# Patient Record
Sex: Female | Born: 1970 | Race: Black or African American | Hispanic: No | Marital: Single | Smoking: Never smoker
Health system: Southern US, Community
[De-identification: ages and names within clinical notes are randomized; demographics above are authoritative.]

## PROBLEM LIST (undated history)

## (undated) DIAGNOSIS — D803 Selective deficiency of immunoglobulin G [IgG] subclasses: Secondary | ICD-10-CM

## (undated) DIAGNOSIS — M869 Osteomyelitis, unspecified: Secondary | ICD-10-CM

## (undated) DIAGNOSIS — K116 Mucocele of salivary gland: Secondary | ICD-10-CM

## (undated) DIAGNOSIS — J189 Pneumonia, unspecified organism: Secondary | ICD-10-CM

## (undated) DIAGNOSIS — IMO0001 Reserved for inherently not codable concepts without codable children: Secondary | ICD-10-CM

## (undated) DIAGNOSIS — D573 Sickle-cell trait: Secondary | ICD-10-CM

## (undated) DIAGNOSIS — M009 Pyogenic arthritis, unspecified: Secondary | ICD-10-CM

## (undated) DIAGNOSIS — B20 Human immunodeficiency virus [HIV] disease: Secondary | ICD-10-CM

## (undated) DIAGNOSIS — I272 Pulmonary hypertension, unspecified: Secondary | ICD-10-CM

## (undated) DIAGNOSIS — D649 Anemia, unspecified: Secondary | ICD-10-CM

## (undated) HISTORY — PX: NO PAST SURGERIES: SHX2092

## (undated) HISTORY — PX: SHOULDER ARTHROSCOPY: SHX128

---

## 2014-12-10 ENCOUNTER — Encounter: Payer: Self-pay | Admitting: Dietician

## 2014-12-10 ENCOUNTER — Encounter: Payer: Medicare Other | Attending: Gastroenterology | Admitting: Dietician

## 2014-12-10 VITALS — Ht 60.0 in | Wt <= 1120 oz

## 2014-12-10 DIAGNOSIS — E43 Unspecified severe protein-calorie malnutrition: Secondary | ICD-10-CM | POA: Diagnosis present

## 2014-12-10 DIAGNOSIS — R636 Underweight: Secondary | ICD-10-CM

## 2014-12-10 DIAGNOSIS — R634 Abnormal weight loss: Secondary | ICD-10-CM | POA: Diagnosis present

## 2014-12-10 NOTE — Patient Instructions (Signed)
   Try an Ensure drink or Carnation breakfast drink 1-2 times a day. Or add dry, powdered milk into milk or pudding or other milky foods.   Continue to eat every 2-3 hours throughout the day. Include a protein food each time you eat (meat, cheese, nuts, peanut butter, eggs)  Talk to your doctor about whether a probiotic supplement such a Culturelle, or florastor might be a good idea.   Boost calories in foods by adding some extra healthy fat, such as vegetable oil, salad dressing, mayonnaise, or soft margarine.

## 2014-12-10 NOTE — Progress Notes (Signed)
Medical Nutrition Therapy: Visit start time: 1300  end time: 1400  Assessment:  Diagnosis: abnormal weight loss, severely malnourished Past medical history: HIV, pulmonary hypertension Psychosocial issues/ stress concerns: none per patient Preferred learning method:  Jill Alexanders . Hands-on  Current weight: 58.6lbs  Height: 5'0" Medications, supplements: reviewed list in chart with patient  Progress and evaluation: Patient reports weight loss of 50+lbs since she developed esophageal issues 2 months ago.     Now diagnosed with candidiasis, pain has resolved, and she is able to eat normally again, but weight gain has been minimal thus far.   Physical activity: none  Dietary Intake:  Usual eating pattern includes 3 meals and multiple snacks per day. Dining out frequency: 0 meals per week.  Breakfast: cereal, milk Snack: same as pm snack(s) Lunch: 1-2pm sandwich or leftovers Snack: chips, nuts and raisins, cookies, granola bars, ice cream Supper: 8/14 ham, greens, squash with onion Snack: cereal or milkshake Beverages: water, juices, soda, tea  Nutrition Care Education: Topics covered: weight gain, esophageal candidiasis Basic nutrition: basic food groups, appropriate nutrient balance. Importance of adequate protein to increase lean body mass. Weight gain: 1800kcal meal plan including 11oz protein foods, 13 servings carbohydrate, 6-7 servings added fats. Ways to increase kcal content of foods, nutrition supplements.   Healthy goal for weight. Esphageal candidiasis:  Causes of and prevention: discussed potential benefit from probiotic supplement, limiting sugary foods and drinks.   Nutritional Diagnosis:  [place]-3.1 Underweight and [place]-3.2 Unintentional weight loss As related to esophageal pain.  As evidenced by BMI of 11.  Intervention: Instruction as noted above.   Patient is eating at regular intervals and seems to have good appetite, eating some calorie-dense foods.   Encouraged addition  of nutritional supplement to not only boost calories, but also increase overall vitamin intake.   Will plan to meet with patient briefly in 2 weeks to check weight and progress. Will schedule further follow-up at that time if needed.  Education Materials given:  . Food lists/ Planning A Balanced Meal . Sample meal pattern/ menus: Quick and Healthy Meal Ideas . Snacking handout . Goals/ instructions . Weight Gain the Healthful Way [phone])  Learner/ who was taught:  . Patient  . Family member daughter  Level of understanding: Marland Kitchen Verbalizes/ demonstrates competency  Demonstrated degree of understanding via:   Teach back Learning barriers: . None  Willingness to learn/ readiness for change: . Eager, change in progress  Monitoring and Evaluation:  Dietary intake and body weight      follow up: 12/24/14

## 2014-12-24 ENCOUNTER — Encounter: Payer: Self-pay | Admitting: Dietician

## 2014-12-24 NOTE — Progress Notes (Signed)
Patient came for weight check; weight today 57.4lbs, which is an increase of 0.6lbs in 2 weeks.  She reports eating chicken, fish, potatoes, beans for meals and is eating several snacks daily of nuts, ice cream, or lunchables.  She has purchased some instant breakfast drinks and will begin adding those in, as well as some fruit smoothies. Suggested she try adding protein powder in the smoothies as well.

## 2015-01-15 ENCOUNTER — Telehealth: Payer: Self-pay | Admitting: Dietician

## 2015-01-15 NOTE — Telephone Encounter (Signed)
Called patient to check on her progress; she reports she has started drinking a nutritional drink (ie Ensure) daily.  She has not had another weight check since 8/29, states energy and strength are improving slowly. Scheduled weight check at this office for 01/21/15 at 4:45pm.

## 2015-01-17 ENCOUNTER — Encounter: Payer: Self-pay | Admitting: *Deleted

## 2015-01-18 ENCOUNTER — Ambulatory Visit: Admission: RE | Admit: 2015-01-18 | Payer: Medicare Other | Source: Ambulatory Visit | Admitting: Gastroenterology

## 2015-01-18 HISTORY — DX: Sickle-cell trait: D57.3

## 2015-01-18 HISTORY — DX: Mucocele of salivary gland: K11.6

## 2015-01-18 HISTORY — DX: Pneumonia, unspecified organism: J18.9

## 2015-01-18 HISTORY — DX: Pulmonary hypertension, unspecified: I27.20

## 2015-01-18 HISTORY — DX: Reserved for inherently not codable concepts without codable children: IMO0001

## 2015-01-18 HISTORY — DX: Osteomyelitis, unspecified: M86.9

## 2015-01-18 HISTORY — DX: Human immunodeficiency virus (HIV) disease: B20

## 2015-01-18 HISTORY — DX: Anemia, unspecified: D64.9

## 2015-01-18 HISTORY — DX: Selective deficiency of immunoglobulin g (igg) subclasses: D80.3

## 2015-01-18 HISTORY — DX: Pyogenic arthritis, unspecified: M00.9

## 2015-01-18 SURGERY — ESOPHAGOGASTRODUODENOSCOPY (EGD) WITH PROPOFOL
Anesthesia: General

## 2015-01-22 ENCOUNTER — Emergency Department: Payer: Medicare Other

## 2015-01-22 ENCOUNTER — Emergency Department
Admission: EM | Admit: 2015-01-22 | Discharge: 2015-01-22 | Disposition: A | Discharge status: Home / Self Care | Payer: Medicare Other | Attending: Emergency Medicine | Admitting: Emergency Medicine

## 2015-01-22 DIAGNOSIS — Z880 Allergy status to penicillin: Secondary | ICD-10-CM | POA: Insufficient documentation

## 2015-01-22 DIAGNOSIS — G629 Polyneuropathy, unspecified: Secondary | ICD-10-CM

## 2015-01-22 DIAGNOSIS — Z79899 Other long term (current) drug therapy: Secondary | ICD-10-CM | POA: Insufficient documentation

## 2015-01-22 DIAGNOSIS — N3090 Cystitis, unspecified without hematuria: Secondary | ICD-10-CM | POA: Insufficient documentation

## 2015-01-22 DIAGNOSIS — R200 Anesthesia of skin: Secondary | ICD-10-CM | POA: Diagnosis present

## 2015-01-22 LAB — CBC WITH DIFFERENTIAL/PLATELET
BASOS ABS: 0 10*3/uL (ref 0–0.1)
Basophils Relative: 0 %
EOS PCT: 7 %
Eosinophils Absolute: 0.2 10*3/uL (ref 0–0.7)
HEMATOCRIT: 24.2 % — AB (ref 35.0–47.0)
HEMOGLOBIN: 7.8 g/dL — AB (ref 12.0–16.0)
LYMPHS ABS: 0.4 10*3/uL — AB (ref 1.0–3.6)
LYMPHS PCT: 17 %
MCH: 27.7 pg (ref 26.0–34.0)
MCHC: 32 g/dL (ref 32.0–36.0)
MCV: 86.4 fL (ref 80.0–100.0)
Monocytes Absolute: 0.3 10*3/uL (ref 0.2–0.9)
Monocytes Relative: 11 %
NEUTROS ABS: 1.6 10*3/uL (ref 1.4–6.5)
NEUTROS PCT: 65 %
PLATELETS: 193 10*3/uL (ref 150–440)
RBC: 2.81 MIL/uL — AB (ref 3.80–5.20)
RDW: 15.7 % — ABNORMAL HIGH (ref 11.5–14.5)
WBC: 2.5 10*3/uL — AB (ref 3.6–11.0)

## 2015-01-22 LAB — URINALYSIS COMPLETE WITH MICROSCOPIC (ARMC ONLY)
Bacteria, UA: NONE SEEN
Bilirubin Urine: NEGATIVE
GLUCOSE, UA: NEGATIVE mg/dL
KETONES UR: NEGATIVE mg/dL
NITRITE: POSITIVE — AB
PROTEIN: 100 mg/dL — AB
SPECIFIC GRAVITY, URINE: 1.02 (ref 1.005–1.030)
pH: 6 (ref 5.0–8.0)

## 2015-01-22 LAB — BASIC METABOLIC PANEL
ANION GAP: 6 (ref 5–15)
BUN: 15 mg/dL (ref 6–20)
CHLORIDE: 100 mmol/L — AB (ref 101–111)
CO2: 23 mmol/L (ref 22–32)
Calcium: 8.2 mg/dL — ABNORMAL LOW (ref 8.9–10.3)
Creatinine, Ser: 0.58 mg/dL (ref 0.44–1.00)
GFR calc Af Amer: >60 mL/min (ref >60)
GLUCOSE: 97 mg/dL (ref 65–99)
POTASSIUM: 3.7 mmol/L (ref 3.5–5.1)
Sodium: 129 mmol/L — ABNORMAL LOW (ref 135–145)

## 2015-01-22 LAB — MAGNESIUM: Magnesium: 1.6 mg/dL — ABNORMAL LOW (ref 1.7–2.4)

## 2015-01-22 LAB — PHOSPHORUS: Phosphorus: 3.3 mg/dL (ref 2.5–4.6)

## 2015-01-22 MED ORDER — NITROFURANTOIN MACROCRYSTAL 100 MG PO CAPS: 100.0000 mg | ORAL_CAPSULE | Freq: Two times a day (BID) | ORAL | Status: DC

## 2015-01-22 MED ORDER — NITROFURANTOIN MONOHYD MACRO 100 MG PO CAPS
100.0000 mg | ORAL_CAPSULE | Freq: Once | ORAL | Status: AC
  Administered 2015-01-22: 100 mg via ORAL
  Filled 2015-01-22: qty 1

## 2015-01-22 NOTE — ED Provider Notes (Signed)
[facility] Emergency Department Provider Note  ____________________________________________  Time seen: 5:30 PM  I have reviewed the triage vital signs and the nursing notes.   HISTORY  Chief Complaint Numbness    HPI Donna Hayden is a 44 y.o. female with HIV and chronic neuropathy of both feet who complains of worsened numbness and tingling in both feet and legs over the past month. It is progressively worsening, and more recently she feels like it is interfering with her walking because she can't feel her feet on the floor to smoothly transition her stridor.  Denies any motor weakness. The symptoms started in her feet where she has chronic neuropathy and progressed proximally. No saddle anesthesia. No bowel or urinary retention or incontinence. No back pain. No fevers, no nausea vomiting or diarrhea. No abdominal pain, no recent trauma     Past Medical History  Diagnosis Date  . HIV (human immunodeficiency virus infection)   . Anemia     Iron Deficient  . Osteomyelitis Bilateral Femoral  . Parotid cyst   . Pneumonia   . Pulmonary hypertension   . Selective deficiency of IgG subclasses   . Septic arthritis   . Sickle cell trait   . Shortness of breath dyspnea      There are no active problems to display for this patient.    Past Surgical History  Procedure Laterality Date  . No past surgeries    . Shoulder arthroscopy       Current Outpatient Rx  Name  Route  Sig  Dispense  Refill  . fluconazole (DIFLUCAN) 40 MG/ML suspension            0   . folic acid (FOLVITE) 1 MG tablet   Oral   Take 1 mg by mouth daily.         . ISENTRESS 400 MG tablet            1     Dispense as written.   . nitrofurantoin (MACRODANTIN) 100 MG capsule   Oral   Take 1 capsule (100 mg total) by mouth 2 (two) times daily.   20 capsule   0   . NORVIR 80 MG/ML solution            1     Dispense as written.   Marland Kitchen PREZISTA 800 MG tablet             1     Dispense as written.   . sildenafil (REVATIO) 20 MG tablet   Oral   Take 20 mg by mouth 3 (three) times daily.         . TRUVADA 200-300 MG per tablet            1     Dispense as written.    Compliant with medications. Currently being treated with Diflucan for esophagitis  Allergies Penicillins and Sulfa antibiotics   Family History  Problem Relation Age of Onset  . Diabetes Mother   . Coarctation of the aorta Other     Social History Social History  Substance Use Topics  . Smoking status: Never Smoker   . Smokeless tobacco: Never Used  . Alcohol Use: No    Review of Systems  Constitutional:   No fever or chills. No weight changes Eyes:   No blurry vision or double vision.  ENT:   No sore throat. Cardiovascular:   No chest pain. Respiratory:   No dyspnea or cough. Gastrointestinal:  Negative for abdominal pain, vomiting and diarrhea.  No BRBPR or melena. Genitourinary:   Negative for dysuria, urinary retention, bloody urine, or difficulty urinating. Musculoskeletal:   Negative for back pain. No joint swelling or pain. Skin:   Negative for rash. Neurological:   Negative for headaches, focal weakness. Bilateral lower extremity paresthesias as above. Psychiatric:  No anxiety or depression.   Endocrine:  No hot/cold intolerance, changes in energy, or sleep difficulty.  10-point ROS otherwise negative.  ____________________________________________   PHYSICAL EXAM:  VITAL SIGNS: ED Triage Vitals  Enc Vitals Group     BP 01/22/15 1722 112/57 mmHg     Pulse Rate 01/22/15 1722 123     Resp 01/22/15 1722 20     Temp 01/22/15 1722 97.6 F (36.4 C)     Temp src --      SpO2 01/22/15 1722 88 %     Weight 01/22/15 1733 56 lb (25.401 kg)     Height 01/22/15 1722 5' (1.524 m)     Head Cir --      Peak Flow --      Pain Score 01/22/15 1723 10     Pain Loc --      Pain Edu? --      Excl. in GC? --      Constitutional:   Alert and  oriented. Well appearing and in no distress. Calm and comfortable. Cachectic Eyes:   No scleral icterus. No conjunctival pallor. PERRL. EOMI ENT   Head:   Normocephalic and atraumatic.   Nose:   No congestion/rhinnorhea. No septal hematoma   Mouth/Throat:   MMM, no pharyngeal erythema. No peritonsillar mass. No uvula shift.   Neck:   No stridor. No SubQ emphysema. No meningismus. Hematological/Lymphatic/Immunilogical:   No cervical lymphadenopathy. Cardiovascular:   RRR. Normal and symmetric distal pulses are present in all extremities. No murmurs, rubs, or gallops. Respiratory:   Normal respiratory effort without tachypnea nor retractions. Breath sounds are clear and equal bilaterally. No wheezes/rales/rhonchi. Gastrointestinal:   Soft and nontender. No distention. There is no CVA tenderness.  No rebound, rigidity, or guarding. Genitourinary:   deferred Musculoskeletal:   Nontender with normal range of motion in all extremities. No joint effusions.  No lower extremity tenderness.  No edema. No midline spinal tenderness Neurologic:   Normal speech and language.  CN 2-10 normal. Motor grossly intact. No pronator drift.   No gross focal neurologic deficits are appreciated.  Skin:    Skin is warm, dry and intact. No rash noted.  No petechiae, purpura, or bullae. Psychiatric:   Mood and affect are normal. Speech and behavior are normal. Patient exhibits appropriate insight and judgment.  ____________________________________________    LABS (pertinent positives/negatives) (all labs ordered are listed, but only abnormal results are displayed) Labs Reviewed  CBC WITH DIFFERENTIAL/PLATELET - Abnormal; Notable for the following:    WBC 2.5 (*)    RBC 2.81 (*)    Hemoglobin 7.8 (*)    HCT 24.2 (*)    RDW 15.7 (*)    Lymphs Abs 0.4 (*)    All other components within normal limits  BASIC METABOLIC PANEL - Abnormal; Notable for the following:    Sodium 129 (*)    Chloride 100 (*)     Calcium 8.2 (*)    All other components within normal limits  MAGNESIUM - Abnormal; Notable for the following:    Magnesium 1.6 (*)    All other components within normal limits  URINALYSIS COMPLETEWITH  MICROSCOPIC ([facility] ONLY) - Abnormal; Notable for the following:    Color, Urine AMBER (*)    APPearance CLOUDY (*)    Hgb urine dipstick 2+ (*)    Protein, ur 100 (*)    Nitrite POSITIVE (*)    Leukocytes, UA TRACE (*)    Squamous Epithelial / LPF 0-5 (*)    All other components within normal limits  URINE CULTURE  PHOSPHORUS   01/14/2015 CD4 count obtained outpatient is 29 ____________________________________________   EKG    ____________________________________________    RADIOLOGY  X-ray chest and l lumbar spine unremarkable  ____________________________________________   PROCEDURES   ____________________________________________   INITIAL IMPRESSION / ASSESSMENT AND PLAN / ED COURSE  Pertinent labs & imaging results that were available during my care of the patient were reviewed by me and considered in my medical decision making (see chart for details).  Patient presents with acute worsening of chronic neuropathy in bilateral lobes ovaries. Low suspicion for cauda equina, epidural abscess or hematoma, osteomyelitis or soft tissue infection. She is chronically at this weight and low suspicion of a vitamin deficiency as a cause for the neuropathy. Mental status is intact. We check labs and x-ray. ----------------------------------------- 8:15 PM on 01/22/2015 -----------------------------------------  Workup significant for nitrite positive urinary tract infection. Otherwise unremarkable. CBC consistent with known baseline AIDS. Patient follows up with ID specialist. We'll have her follow-up in 2 days while starting Macrobid. She is otherwise well-appearing, no sepsis no pyelonephritis. Tachycardia  resolved.   ____________________________________________   FINAL CLINICAL IMPRESSION(S) / ED DIAGNOSES  Final diagnoses:  Cystitis  Neuropathy      Sharman Cheek, MD 01/22/15 2016

## 2015-01-22 NOTE — ED Notes (Signed)
Lab notified to add urine, spoke Bjorn Loser, states will add.

## 2015-01-22 NOTE — ED Notes (Signed)
Pt c/o BL leg weakness with numbness/tingling in BL feet that has progressed for the past 2 months, states she is unable to walk now.Donna Hayden

## 2015-01-22 NOTE — Discharge Instructions (Signed)
Peripheral Neuropathy °Peripheral neuropathy is a type of nerve damage. It affects nerves that carry signals between the spinal cord and other parts of the body. These are called peripheral nerves. With peripheral neuropathy, one nerve or a group of nerves may be damaged.  °CAUSES  °Many things can damage peripheral nerves. For some people with peripheral neuropathy, the cause is unknown. Some causes include: °· Diabetes. This is the most common cause of peripheral neuropathy. °· Injury to a nerve. °· Pressure or stress on a nerve that lasts a long time. °· Too little vitamin B. Alcoholism can lead to this. °· Infections. °· Autoimmune diseases, such as multiple sclerosis and systemic lupus erythematosus. °· Inherited nerve diseases. °· Some medicines, such as cancer drugs. °· Toxic substances, such as lead and mercury. °· Too little blood flowing to the legs. °· Kidney disease. °· Thyroid disease. °SIGNS AND SYMPTOMS  °Different people have different symptoms. The symptoms you have will depend on which of your nerves is damaged.  Common symptoms include: °· Loss of feeling (numbness) in the feet and hands. °· Tingling in the feet and hands. °· Pain that burns. °· Very sensitive skin. °· Weakness. °· Not being able to move a part of the body (paralysis). °· Muscle twitching. °· Clumsiness or poor coordination. °· Loss of balance. °· Not being able to control your bladder. °· Feeling dizzy. °· Sexual problems. °DIAGNOSIS  °Peripheral neuropathy is a symptom, not a disease. Finding the cause of peripheral neuropathy can be hard. To figure that out, your health care provider will take a medical history and do a physical exam. A neurological exam will also be done. This involves checking things affected by your brain, spinal cord, and nerves (nervous system). For example, your health care provider will check your reflexes, how you move, and what you can feel.  °Other types of tests may also be ordered, such as: °· Blood  tests. °· A test of the fluid in your spinal cord. °· Imaging tests, such as CT scans or an MRI. °· Electromyography (EMG). This test checks the nerves that control muscles. °· Nerve conduction velocity tests. These tests check how fast messages pass through your nerves. °· Nerve biopsy. A small piece of nerve is removed. It is then checked under a microscope. °TREATMENT  °· Medicine is often used to treat peripheral neuropathy. Medicines may include: °¨ Pain-relieving medicines. Prescription or over-the-counter medicine may be suggested. °¨ Antiseizure medicine. This may be used for pain. °¨ Antidepressants. These also may help ease pain from neuropathy. °¨ Lidocaine. This is a numbing medicine. You might wear a patch or be given a shot. °¨ Mexiletine. This medicine is typically used to help control irregular heart rhythms. °· Surgery. Surgery may be needed to relieve pressure on a nerve or to destroy a nerve that is causing pain. °· Physical therapy to help movement. °· Assistive devices to help movement. °HOME CARE INSTRUCTIONS  °· Only take over-the-counter or prescription medicines as directed by your health care provider. Follow the instructions carefully for any given medicines. Do not take any other medicines without first getting approval from your health care provider. °· If you have diabetes, work closely with your health care provider to keep your blood sugar under control. °· If you have numbness in your feet: °¨ Check every day for signs of injury or infection. Watch for redness, warmth, and swelling. °¨ Wear padded socks and comfortable shoes. These help protect your feet. °· Do not do   things that put pressure on your damaged nerve. °· Do not smoke. Smoking keeps blood from getting to damaged nerves. °· Avoid or limit alcohol. Too much alcohol can cause a lack of B vitamins. These vitamins are needed for healthy nerves. °· Develop a good support system. Coping with peripheral neuropathy can be  stressful. Talk to a mental health specialist or join a support group if you are struggling. °· Follow up with your health care provider as directed. °SEEK MEDICAL CARE IF:  °· You have new signs or symptoms of peripheral neuropathy. °· You are struggling emotionally from dealing with peripheral neuropathy. °· You have a fever. °SEEK IMMEDIATE MEDICAL CARE IF:  °· You have an injury or infection that is not healing. °· You feel very dizzy or begin vomiting. °· You have chest pain. °· You have trouble breathing. °Document Released: 04/03/2002 Document Revised: 12/24/2010 Document Reviewed: 12/19/2012 °ExitCare® Patient Information ©2015 [employer]. This information is not intended to replace advice given to you by your health care provider. Make sure you discuss any questions you have with your health care provider. ° ° °Urinary Tract Infection °Urinary tract infections (UTIs) can develop anywhere along your urinary tract. Your urinary tract is your body's drainage system for removing wastes and extra water. Your urinary tract includes two kidneys, two ureters, a bladder, and a urethra. Your kidneys are a pair of bean-shaped organs. Each kidney is about the size of your fist. They are located below your ribs, one on each side of your spine. °CAUSES °Infections are caused by microbes, which are microscopic organisms, including fungi, viruses, and bacteria. These organisms are so small that they can only be seen through a microscope. Bacteria are the microbes that most commonly cause UTIs. °SYMPTOMS  °Symptoms of UTIs may vary by age and gender of the patient and by the location of the infection. Symptoms in young women typically include a frequent and intense urge to urinate and a painful, burning feeling in the bladder or urethra during urination. Older women and men are more likely to be tired, shaky, and weak and have muscle aches and abdominal pain. A fever may mean the infection is in your kidneys. Other  symptoms of a kidney infection include pain in your back or sides below the ribs, nausea, and vomiting. °DIAGNOSIS °To diagnose a UTI, your caregiver will ask you about your symptoms. Your caregiver also will ask to provide a urine sample. The urine sample will be tested for bacteria and white blood cells. White blood cells are made by your body to help fight infection. °TREATMENT  °Typically, UTIs can be treated with medication. Because most UTIs are caused by a bacterial infection, they usually can be treated with the use of antibiotics. The choice of antibiotic and length of treatment depend on your symptoms and the type of bacteria causing your infection. °HOME CARE INSTRUCTIONS °· If you were prescribed antibiotics, take them exactly as your caregiver instructs you. Finish the medication even if you feel better after you have only taken some of the medication. °· Drink enough water and fluids to keep your urine clear or pale yellow. °· Avoid caffeine, tea, and carbonated beverages. They tend to irritate your bladder. °· Empty your bladder often. Avoid holding urine for long periods of time. °· Empty your bladder before and after sexual intercourse. °· After a bowel movement, women should cleanse from front to back. Use each tissue only once. °SEEK MEDICAL CARE IF:  °· You have   back pain. °· You develop a fever. °· Your symptoms do not begin to resolve within 3 days. °SEEK IMMEDIATE MEDICAL CARE IF:  °· You have severe back pain or lower abdominal pain. °· You develop chills. °· You have nausea or vomiting. °· You have continued burning or discomfort with urination. °MAKE SURE YOU:  °· Understand these instructions. °· Will watch your condition. °· Will get help right away if you are not doing well or get worse. °Document Released: 01/21/2005 Document Revised: 10/13/2011 Document Reviewed: 05/22/2011 °ExitCare® Patient Information ©2015 [employer]. This information is not intended to replace advice given to  you by your health care provider. Make sure you discuss any questions you have with your health care provider. ° °

## 2015-01-24 LAB — URINE CULTURE

## 2015-02-21 ENCOUNTER — Telehealth: Payer: Self-pay | Admitting: Dietician

## 2015-02-21 ENCOUNTER — Encounter: Payer: Self-pay | Admitting: *Deleted

## 2015-02-21 NOTE — Telephone Encounter (Signed)
Called patient to check on progress with weight gain, she reports recent weight of 60lbs, which is an increase of 1.4lbs in the past 2 months.  She states she is still drinking Ensure daily.  Encouraged her to call as needed, and will plan to call again in 4-5 weeks to possibly schedule another weight check.

## 2015-02-22 ENCOUNTER — Encounter: Payer: Self-pay | Admitting: Certified Registered Nurse Anesthetist

## 2015-02-22 ENCOUNTER — Ambulatory Visit: Admission: RE | Admit: 2015-02-22 | Payer: Medicare Other | Source: Ambulatory Visit | Admitting: Gastroenterology

## 2015-02-22 ENCOUNTER — Encounter: Admission: RE | Payer: Self-pay | Source: Ambulatory Visit

## 2015-02-22 SURGERY — ESOPHAGOGASTRODUODENOSCOPY (EGD) WITH PROPOFOL
Anesthesia: General

## 2015-03-01 ENCOUNTER — Emergency Department: Payer: Medicare Other

## 2015-03-01 ENCOUNTER — Inpatient Hospital Stay
Admission: EM | Admit: 2015-03-01 | Discharge: 2015-03-12 | DRG: 199 | Disposition: A | Discharge status: Home / Self Care | Payer: Medicare Other | Attending: Internal Medicine | Admitting: Internal Medicine

## 2015-03-01 ENCOUNTER — Encounter: Payer: Self-pay | Admitting: *Deleted

## 2015-03-01 DIAGNOSIS — H30892 Other chorioretinal inflammations, left eye: Secondary | ICD-10-CM | POA: Diagnosis present

## 2015-03-01 DIAGNOSIS — A419 Sepsis, unspecified organism: Secondary | ICD-10-CM | POA: Diagnosis not present

## 2015-03-01 DIAGNOSIS — R0902 Hypoxemia: Secondary | ICD-10-CM | POA: Diagnosis present

## 2015-03-01 DIAGNOSIS — H5442 Blindness, left eye, normal vision right eye: Secondary | ICD-10-CM | POA: Diagnosis present

## 2015-03-01 DIAGNOSIS — Z515 Encounter for palliative care: Secondary | ICD-10-CM | POA: Diagnosis present

## 2015-03-01 DIAGNOSIS — R64 Cachexia: Secondary | ICD-10-CM | POA: Diagnosis present

## 2015-03-01 DIAGNOSIS — J939 Pneumothorax, unspecified: Secondary | ICD-10-CM | POA: Insufficient documentation

## 2015-03-01 DIAGNOSIS — D709 Neutropenia, unspecified: Secondary | ICD-10-CM | POA: Diagnosis present

## 2015-03-01 DIAGNOSIS — Z833 Family history of diabetes mellitus: Secondary | ICD-10-CM

## 2015-03-01 DIAGNOSIS — E871 Hypo-osmolality and hyponatremia: Secondary | ICD-10-CM | POA: Diagnosis present

## 2015-03-01 DIAGNOSIS — W1830XA Fall on same level, unspecified, initial encounter: Secondary | ICD-10-CM | POA: Diagnosis present

## 2015-03-01 DIAGNOSIS — J189 Pneumonia, unspecified organism: Secondary | ICD-10-CM | POA: Diagnosis present

## 2015-03-01 DIAGNOSIS — E162 Hypoglycemia, unspecified: Secondary | ICD-10-CM | POA: Diagnosis not present

## 2015-03-01 DIAGNOSIS — M869 Osteomyelitis, unspecified: Secondary | ICD-10-CM | POA: Diagnosis present

## 2015-03-01 DIAGNOSIS — B20 Human immunodeficiency virus [HIV] disease: Secondary | ICD-10-CM | POA: Diagnosis present

## 2015-03-01 DIAGNOSIS — E43 Unspecified severe protein-calorie malnutrition: Secondary | ICD-10-CM | POA: Diagnosis present

## 2015-03-01 DIAGNOSIS — Z880 Allergy status to penicillin: Secondary | ICD-10-CM

## 2015-03-01 DIAGNOSIS — Z882 Allergy status to sulfonamides status: Secondary | ICD-10-CM

## 2015-03-01 DIAGNOSIS — R1310 Dysphagia, unspecified: Secondary | ICD-10-CM

## 2015-03-01 DIAGNOSIS — D638 Anemia in other chronic diseases classified elsewhere: Secondary | ICD-10-CM | POA: Diagnosis present

## 2015-03-01 DIAGNOSIS — E876 Hypokalemia: Secondary | ICD-10-CM | POA: Diagnosis present

## 2015-03-01 DIAGNOSIS — N390 Urinary tract infection, site not specified: Secondary | ICD-10-CM | POA: Diagnosis present

## 2015-03-01 DIAGNOSIS — D573 Sickle-cell trait: Secondary | ICD-10-CM | POA: Diagnosis present

## 2015-03-01 DIAGNOSIS — Y92009 Unspecified place in unspecified non-institutional (private) residence as the place of occurrence of the external cause: Secondary | ICD-10-CM

## 2015-03-01 DIAGNOSIS — I272 Other secondary pulmonary hypertension: Secondary | ICD-10-CM | POA: Diagnosis present

## 2015-03-01 DIAGNOSIS — S270XXA Traumatic pneumothorax, initial encounter: Principal | ICD-10-CM | POA: Diagnosis present

## 2015-03-01 DIAGNOSIS — R6521 Severe sepsis with septic shock: Secondary | ICD-10-CM | POA: Diagnosis not present

## 2015-03-01 DIAGNOSIS — R000 Tachycardia, unspecified: Secondary | ICD-10-CM | POA: Diagnosis present

## 2015-03-01 DIAGNOSIS — L8990 Pressure ulcer of unspecified site, unspecified stage: Secondary | ICD-10-CM

## 2015-03-01 DIAGNOSIS — B3781 Candidal esophagitis: Secondary | ICD-10-CM | POA: Diagnosis present

## 2015-03-01 DIAGNOSIS — B259 Cytomegaloviral disease, unspecified: Secondary | ICD-10-CM

## 2015-03-01 DIAGNOSIS — L89323 Pressure ulcer of left buttock, stage 3: Secondary | ICD-10-CM | POA: Diagnosis present

## 2015-03-01 DIAGNOSIS — H3090 Unspecified chorioretinal inflammation, unspecified eye: Secondary | ICD-10-CM

## 2015-03-01 DIAGNOSIS — A498 Other bacterial infections of unspecified site: Secondary | ICD-10-CM | POA: Diagnosis present

## 2015-03-01 DIAGNOSIS — Z9119 Patient's noncompliance with other medical treatment and regimen: Secondary | ICD-10-CM | POA: Diagnosis not present

## 2015-03-01 DIAGNOSIS — L89150 Pressure ulcer of sacral region, unstageable: Secondary | ICD-10-CM | POA: Diagnosis present

## 2015-03-01 LAB — COMPREHENSIVE METABOLIC PANEL
ALT: 7 U/L — ABNORMAL LOW (ref 14–54)
ANION GAP: 3 — AB (ref 5–15)
AST: 29 U/L (ref 15–41)
Albumin: 1.9 g/dL — ABNORMAL LOW (ref 3.5–5.0)
Alkaline Phosphatase: 36 U/L — ABNORMAL LOW (ref 38–126)
BILIRUBIN TOTAL: 0.7 mg/dL (ref 0.3–1.2)
BUN: 13 mg/dL (ref 6–20)
CHLORIDE: 103 mmol/L (ref 101–111)
CO2: 26 mmol/L (ref 22–32)
Calcium: 8.3 mg/dL — ABNORMAL LOW (ref 8.9–10.3)
Creatinine, Ser: 0.61 mg/dL (ref 0.44–1.00)
GFR calc Af Amer: >60 mL/min (ref >60)
Glucose, Bld: 91 mg/dL (ref 65–99)
POTASSIUM: 3.3 mmol/L — AB (ref 3.5–5.1)
Sodium: 132 mmol/L — ABNORMAL LOW (ref 135–145)
TOTAL PROTEIN: 11.3 g/dL — AB (ref 6.5–8.1)

## 2015-03-01 LAB — CBC WITH DIFFERENTIAL/PLATELET
BASOS ABS: 0 10*3/uL (ref 0–0.1)
Eosinophils Absolute: 0 10*3/uL (ref 0–0.7)
HEMATOCRIT: 25.7 % — AB (ref 35.0–47.0)
HEMOGLOBIN: 8.1 g/dL — AB (ref 12.0–16.0)
LYMPHS ABS: 0.6 10*3/uL — AB (ref 1.0–3.6)
MCH: 26.8 pg (ref 26.0–34.0)
MCHC: 31.6 g/dL — AB (ref 32.0–36.0)
MCV: 84.9 fL (ref 80.0–100.0)
MONO ABS: 0.4 10*3/uL (ref 0.2–0.9)
Monocytes Relative: 13 %
NEUTROS ABS: 2.1 10*3/uL (ref 1.4–6.5)
Platelets: 189 10*3/uL (ref 150–440)
RBC: 3.03 MIL/uL — AB (ref 3.80–5.20)
RDW: 18.1 % — AB (ref 11.5–14.5)
WBC: 3.1 10*3/uL — AB (ref 3.6–11.0)

## 2015-03-01 LAB — PROTIME-INR
INR: 1.18
PROTHROMBIN TIME: 15.2 s — AB (ref 11.4–15.0)

## 2015-03-01 MED ORDER — FENTANYL CITRATE (PF) 100 MCG/2ML IJ SOLN
12.5000 ug | Freq: Once | INTRAMUSCULAR | Status: AC
  Administered 2015-03-01: 12.5 ug via INTRAVENOUS
  Filled 2015-03-01 (×2): qty 2

## 2015-03-01 MED ORDER — MORPHINE SULFATE (PF) 2 MG/ML IV SOLN
2.0000 mg | INTRAVENOUS | Status: DC | PRN
  Administered 2015-03-03 – 2015-03-09 (×7): 2 mg via INTRAVENOUS
  Filled 2015-03-01 (×7): qty 1

## 2015-03-01 MED ORDER — SODIUM CHLORIDE 0.9 % IV BOLUS (SEPSIS)
500.0000 mL | Freq: Once | INTRAVENOUS | Status: AC
  Administered 2015-03-01: 500 mL via INTRAVENOUS

## 2015-03-01 MED ORDER — ACETAMINOPHEN 650 MG RE SUPP
650.0000 mg | Freq: Four times a day (QID) | RECTAL | Status: DC | PRN
  Administered 2015-03-06: 650 mg via RECTAL
  Filled 2015-03-01: qty 1

## 2015-03-01 MED ORDER — MORPHINE SULFATE (PF) 2 MG/ML IV SOLN
2.0000 mg | Freq: Once | INTRAVENOUS | Status: AC
  Administered 2015-03-01: 2 mg via INTRAVENOUS
  Filled 2015-03-01: qty 1

## 2015-03-01 MED ORDER — ACETAMINOPHEN 325 MG PO TABS
650.0000 mg | ORAL_TABLET | Freq: Four times a day (QID) | ORAL | Status: DC | PRN
  Administered 2015-03-02 – 2015-03-08 (×3): 650 mg via ORAL
  Filled 2015-03-01 (×3): qty 2

## 2015-03-01 MED ORDER — OXYCODONE HCL 5 MG PO TABS: 5.0000 mg | ORAL_TABLET | ORAL | Status: DC | PRN

## 2015-03-01 MED ORDER — SODIUM CHLORIDE 0.9 % IV SOLN
INTRAVENOUS | Status: DC
  Administered 2015-03-02 – 2015-03-06 (×9): via INTRAVENOUS

## 2015-03-01 MED ORDER — ONDANSETRON HCL 4 MG PO TABS: 4.0000 mg | ORAL_TABLET | Freq: Four times a day (QID) | ORAL | Status: DC | PRN

## 2015-03-01 MED ORDER — IPRATROPIUM-ALBUTEROL 0.5-2.5 (3) MG/3ML IN SOLN: 3.0000 mL | RESPIRATORY_TRACT | Status: DC | PRN

## 2015-03-01 MED ORDER — ONDANSETRON HCL 4 MG/2ML IJ SOLN
4.0000 mg | Freq: Four times a day (QID) | INTRAMUSCULAR | Status: DC | PRN
  Administered 2015-03-04 – 2015-03-09 (×4): 4 mg via INTRAVENOUS
  Filled 2015-03-01 (×4): qty 2

## 2015-03-01 MED ORDER — HEPARIN SODIUM (PORCINE) 5000 UNIT/ML IJ SOLN
5000.0000 [IU] | Freq: Three times a day (TID) | INTRAMUSCULAR | Status: DC
  Administered 2015-03-02 – 2015-03-12 (×30): 5000 [IU] via SUBCUTANEOUS
  Filled 2015-03-01 (×30): qty 1

## 2015-03-01 NOTE — H&P (Signed)
[facility] - [place] at [facility]   PATIENT NAME: Donna LarssonStephanie Hayden    MR#:  [id number]  DATE OF BIRTH:  [birth year]   DATE OF ADMISSION:  03/01/2015  PRIMARY CARE PHYSICIAN: WOODS, Garnette CzechHRISTOPHER WILDRICK, MD   REQUESTING/REFERRING PHYSICIAN: Quale  CHIEF COMPLAINT:   Chief Complaint  Patient presents with  . Shoulder Injury    HISTORY OF PRESENT ILLNESS:  Donna LarssonStephanie Hayden  is a 44 y.o. female with a known history of advanced HIV, pulmonary hypertension who is presenting after mechanical fall. Patient was at home suffered a mechanical fall, felt immediate pain in the right side midaxillary line described as "pain" 8/10 nonradiating now worsening or relieving associated shortness of breath. Found to have a large right-sided pneumothorax. Chest tube placed in emergency department  PAST MEDICAL HISTORY:   Past Medical History  Diagnosis Date  . HIV (human immunodeficiency virus infection) (HCC)   . Anemia     Iron Deficient  . Osteomyelitis (HCC) Bilateral Femoral  . Parotid cyst   . Pneumonia   . Pulmonary hypertension (HCC)   . Selective deficiency of IgG subclasses (HCC)   . Septic arthritis (HCC)   . Sickle cell trait (HCC)   . Shortness of breath dyspnea     PAST SURGICAL HISTORY:   Past Surgical History  Procedure Laterality Date  . No past surgeries    . Shoulder arthroscopy      SOCIAL HISTORY:   Social History  Substance Use Topics  . Smoking status: Never Smoker   . Smokeless tobacco: Never Used  . Alcohol Use: No    FAMILY HISTORY:   Family History  Problem Relation Age of Onset  . Diabetes Mother   . Coarctation of the aorta Other     DRUG ALLERGIES:   Allergies  Allergen Reactions  . Penicillins Hives  . Sulfa Antibiotics Nausea Only and Rash    Rash and nausea. No breathing issues    REVIEW OF SYSTEMS:  REVIEW OF SYSTEMS:  CONSTITUTIONAL: Denies fevers, chills, fatigue, weakness.  EYES: Denies blurred  vision, double vision, or eye pain.  EARS, NOSE, THROAT: Denies tinnitus, ear pain, hearing loss.  RESPIRATORY: denies cough, positive shortness of breath, denies wheezing  CARDIOVASCULAR: Denies chest pain, palpitations, edema.  GASTROINTESTINAL: Denies nausea, vomiting, diarrhea, abdominal pain.  GENITOURINARY: Denies dysuria, hematuria.  ENDOCRINE: Denies nocturia or thyroid problems. HEMATOLOGIC AND LYMPHATIC: Denies easy bruising or bleeding.  SKIN: Denies rash or lesions.  MUSCULOSKELETAL: Denies pain in neck, back, shoulder, knees, hips, or further arthritic symptoms.  NEUROLOGIC: Denies paralysis, paresthesias.  PSYCHIATRIC: Denies anxiety or depressive symptoms. Otherwise full review of systems performed by me is negative.   MEDICATIONS AT HOME:   Prior to Admission medications   Medication Sig Start Date End Date Taking? Authorizing Provider  azithromycin (ZITHROMAX) 200 MG/5ML suspension Take 15 mLs by mouth once a week. 02/14/15  Yes Historical Provider, MD  fluconazole (DIFLUCAN) 40 MG/ML suspension Take 200 mg by mouth daily.    Yes Historical Provider, MD  folic acid (FOLVITE) 1 MG tablet Take 1 mg by mouth daily.    Historical Provider, MD  ISENTRESS 400 MG tablet  10/19/14   Historical Provider, MD  nitrofurantoin (MACRODANTIN) 100 MG capsule Take 1 capsule (100 mg total) by mouth 2 (two) times daily. 01/22/15   Sharman CheekPhillip Stafford, MD  NORVIR 80 MG/ML solution  10/22/14   Historical Provider, MD  PREZISTA 800 MG tablet  10/22/14   Historical Provider,  MD  sildenafil (REVATIO) 20 MG tablet Take 20 mg by mouth 3 (three) times daily.    Historical Provider, MD  TRUVADA 200-300 MG per tablet  10/19/14   Historical Provider, MD      VITAL SIGNS:  Blood pressure 105/76, pulse 85, temperature 98.4 F (36.9 C), temperature source Oral, resp. rate 32, height 5' (1.524 m), weight 56 lb (25.401 kg), SpO2 100 %.  PHYSICAL EXAMINATION:  VITAL SIGNS: Filed Vitals:   03/01/15 2330   BP: 105/76  Pulse: 85  Temp:   Resp: 32   GENERAL:44 y.o.female currently in no acute distress. Chronically ill-appearing HEAD: Normocephalic, atraumatic.  EYES: Pupils equal, round, reactive to light. Extraocular muscles intact. No scleral icterus.  MOUTH: Moist mucosal membrane. Dentition intact. No abscess noted.  EAR, NOSE, THROAT: Clear without exudates. No external lesions.  NECK: Supple. No thyromegaly. No nodules. No JVD.  PULMONARY: Diminished breath sounds right side without frank wheeze rails or rhonchi. No use of accessory muscles, Good respiratory effort. good air entry bilaterally CHEST: Nontender to palpation.  CARDIOVASCULAR: S1 and S2. Regular rate and rhythm. No murmurs, rubs, or gallops. No edema. Pedal pulses 2+ bilaterally.  GASTROINTESTINAL: Soft, nontender, nondistended. No masses. Positive bowel sounds. No hepatosplenomegaly.  MUSCULOSKELETAL: No swelling, clubbing, or edema. Range of motion full in all extremities.  NEUROLOGIC: Cranial nerves II through XII are intact. No gross focal neurological deficits. Sensation intact. Reflexes intact.  SKIN: No ulceration, lesions, rashes, or cyanosis. Skin warm and dry. Turgor intact.  PSYCHIATRIC: Mood, affect within blunted. The patient is awake, alert and oriented x 3. Insight, judgment intact.    LABORATORY PANEL:   CBC  Recent Labs Lab 03/01/15 2110  WBC 3.1*  HGB 8.1*  HCT 25.7*  PLT 189   ------------------------------------------------------------------------------------------------------------------  Chemistries   Recent Labs Lab 03/01/15 2110  NA 132*  K 3.3*  CL 103  CO2 26  GLUCOSE 91  BUN 13  CREATININE 0.61  CALCIUM 8.3*  AST 29  ALT 7*  ALKPHOS 36*  BILITOT 0.7   ------------------------------------------------------------------------------------------------------------------  Cardiac Enzymes No results for input(s): TROPONINI in the last 168  hours. ------------------------------------------------------------------------------------------------------------------  RADIOLOGY:  Dg Shoulder Right  03/01/2015  CLINICAL DATA:  Right shoulder pain after fall. EXAM: RIGHT SHOULDER - 2+ VIEW COMPARISON:  None. FINDINGS: There is no evidence of fracture or dislocation. There is no evidence of arthropathy or other focal bone abnormality. Soft tissues are unremarkable. There is a right sided pneumothorax identified. This measures approximately 15%. No displaced rib fractures identified. IMPRESSION: 1. No evidence for shoulder dislocation or fracture. 2. Right-sided pneumothorax. Recommend further investigation with PA and lateral chest radiograph. Critical Value/emergent results were called by telephone at the time of interpretation on 03/01/2015 at 8:27 pm to Dr. Sharyn Creamer , who verbally acknowledged these results. Electronically Signed   By: Signa Kell M.D.   On: 03/01/2015 20:27   Dg Chest Port 1 View  03/01/2015  CLINICAL DATA:  Status post right chest tube placement for pneumothorax. EXAM: PORTABLE CHEST 1 VIEW COMPARISON:  03/01/2015 at 2040 hours FINDINGS: Pigtail chest tube has a placed on the right. The tip curls at the right apex. The pneumothorax has been partly evacuated. A small pneumothorax persists along the right lateral mid to upper lung. Lungs are clear.  No pleural effusion.  No left pneumothorax. IMPRESSION: 1. Status post right-sided chest tube placement with significant re-expansion of the right lung. There is a small residual pneumothorax along the right  lateral mid to upper hemi thorax. Electronically Signed   By: Amie Portland M.D.   On: 03/01/2015 23:38   Dg Chest Port 1 View  03/01/2015  CLINICAL DATA:  Right chest pain after fall today EXAM: PORTABLE CHEST 1 VIEW COMPARISON:  01/22/2015 FINDINGS: There is a large right pneumothorax, approximately 30% by volume. Left lung is well expanded and clear. Hilar and mediastinal  contours are unremarkable. No displaced rib fractures are evident. IMPRESSION: Large right pneumothorax. These results were called by telephone at the time of interpretation on 03/01/2015 at 9:14 pm to Dr. Claris Gladden, who verbally acknowledged these results. Electronically Signed   By: Ellery Plunk M.D.   On: 03/01/2015 21:14   Dg Hip Port Unilat With Pelvis 1v Left  03/01/2015  CLINICAL DATA:  Left hip pain following a fall today. EXAM: DG HIP (WITH OR WITHOUT PELVIS) 1V PORT LEFT COMPARISON:  None. FINDINGS: There is no evidence of hip fracture or dislocation. There is no evidence of arthropathy or other focal bone abnormality. IMPRESSION: Negative. Electronically Signed   By: Amie Portland M.D.   On: 03/01/2015 21:21    EKG:  No orders found for this or any previous visit.  IMPRESSION AND PLAN:   44 year old African-American female history of advanced HIV presenting after mechanical fall found to have right-sided pneumothorax 1 right-sided pneumothorax: Continue chest tube to suction general surgery following add breathing treatments 2. Hyponatremia IV fluid hydration follow sodium level III. Hypokalemia replace potassium to goal 4-5 4. HIV restart home medications 5. Venous thrombus embolism prophylactic: Heparin subcutaneous    All the records are reviewed and case discussed with ED provider. Management plans discussed with the patient, family and they are in agreement.  CODE STATUS: Full  TOTAL TIME TAKING CARE OF THIS PATIENT: 35 minutes.    Hower,  Mardi Mainland.D on 03/01/2015 at 11:52 PM  Between 7am to 6pm - Pager - [phone]  After 6pm: House Pager: - [phone]  [facility]  Office  [phone]  CC: Primary care physician; Willodean Rosenthal, MD

## 2015-03-01 NOTE — Op Note (Signed)
*   No surgery found *  10:40 PM  PATIENT:  Donna Hayden  44 y.o. female  PRE-OPERATIVE DIAGNOSIS: Right pneumothorax, traumatic  POST-OPERATIVE DIAGNOSIS:  Same  PROCEDURE:  Right sided 14 French chest tube catheter placement  SURGEON:  Raynald KempMark a Zyion Leidner, M.D.  ASSISTANTS: none  ANESTHESIA: 5 cc 1% plain lidocaine, 12.5 g IV fentanyl     SPECIMEN: none.  EBL: minimal  Description of procedure:    With informed consent supine position and timeout observed the right chest was sterilely prepped and draped utilizing chlor prep solution. A total of 5 cc was infiltrated on the right mid axillary line. Small skin incision was made with a #11 blade. 18-gauge needle with syringe attached was inserted into the chest cavity air was confirmed in the syringe. Wire was passed. Needle was removed. A 14 French dilatation was performed over the wire utilizing Seldinger technique. The 14 French chest tube was then inserted over a dilator and secured with silk suture at the skin. There was air present upon completion of the procedure. The tube was placed in atrium canister at -20 cm of suction. A chest x-ray is currently pending.  Raynald KempMark A Tasheem Elms, MD, FACS

## 2015-03-01 NOTE — ED Notes (Signed)
Chest tube coolection system hooked to suction, seal intact, cough produced bubbles in water seal chamber, no output in collection system, breath sounds on R lungs auscultated and were clear. Dressing clean, dry, intact. Foam tape externally, dressing applied by surgeon post procedure.

## 2015-03-01 NOTE — Consult Note (Signed)
Patient ID: Donna Hayden, female   DOB: May 07, 1970, 44 y.o.   MRN: [id number]  Chief Complaint  Patient presents with  . Shoulder Injury    HPI  Donna Hayden is a 44 y.o. female. With HIV and severe cachexia presents following a fall at home from a chair sustaining a right pneumothorax.  Surgery asked to evaluate.  Past Medical History  Diagnosis Date  . HIV (human immunodeficiency virus infection) ([place])   . Anemia     Iron Deficient  . Osteomyelitis (HCC) Bilateral Femoral  . Parotid cyst   . Pneumonia   . Pulmonary hypertension ([place])   . Selective deficiency of IgG subclasses ([place])   . Septic arthritis ([place])   . Sickle cell trait ([place])   . Shortness of breath dyspnea     Past Surgical History  Procedure Laterality Date  . No past surgeries    . Shoulder arthroscopy      Family History  Problem Relation Age of Onset  . Diabetes Mother   . Coarctation of the aorta Other     Social History Social History  Substance Use Topics  . Smoking status: Never Smoker   . Smokeless tobacco: Never Used  . Alcohol Use: No    Allergies  Allergen Reactions  . Penicillins Hives  . Sulfa Antibiotics Nausea Only and Rash    Rash and nausea. No breathing issues    Current Facility-Administered Medications  Medication Dose Route Frequency Provider Last Rate Last Dose  . fentaNYL (SUBLIMAZE) injection 12.5 mcg  12.5 mcg Intravenous Once Sherri Rad, MD       Current Outpatient Prescriptions  Medication Sig Dispense Refill  . azithromycin (ZITHROMAX) 500 MG tablet Take by mouth daily.    . fluconazole (DIFLUCAN) 40 MG/ML suspension   0  . folic acid (FOLVITE) 1 MG tablet Take 1 mg by mouth daily.    . ISENTRESS 400 MG tablet   1  . nitrofurantoin (MACRODANTIN) 100 MG capsule Take 1 capsule (100 mg total) by mouth 2 (two) times daily. 20 capsule 0  . NORVIR 80 MG/ML solution   1  . PREZISTA 800 MG tablet   1  . sildenafil (REVATIO) 20 MG tablet Take 20 mg by mouth 3  (three) times daily.    . TRUVADA 200-300 MG per tablet   1     Blood pressure 103/77, pulse 112, temperature 98.4 F (36.9 C), temperature source Oral, resp. rate 17, height 5' (1.524 m), weight 56 lb (25.401 kg), SpO2 100 %.  Results for orders placed or performed during the hospital encounter of 03/01/15 (from the past 48 hour(s))  Comprehensive metabolic panel     Status: Abnormal   Collection Time: 03/01/15  9:10 PM  Result Value Ref Range   Sodium 132 (L) 135 - 145 mmol/L   Potassium 3.3 (L) 3.5 - 5.1 mmol/L   Chloride 103 101 - 111 mmol/L   CO2 26 22 - 32 mmol/L   Glucose, Bld 91 65 - 99 mg/dL   BUN 13 6 - 20 mg/dL   Creatinine, Ser 0.61 0.44 - 1.00 mg/dL   Calcium 8.3 (L) 8.9 - 10.3 mg/dL   Total Protein 11.3 (H) 6.5 - 8.1 g/dL   Albumin 1.9 (L) 3.5 - 5.0 g/dL   AST 29 15 - 41 U/L   ALT 7 (L) 14 - 54 U/L   Alkaline Phosphatase 36 (L) 38 - 126 U/L   Total Bilirubin 0.7 0.3 - 1.2 mg/dL  GFR calc non Af Amer >60 >60 mL/min   GFR calc Af Amer >60 >60 mL/min    Comment: (NOTE) The eGFR has been calculated using the CKD EPI equation. This calculation has not been validated in all clinical situations. eGFR's persistently <60 mL/min signify possible Chronic Kidney Disease.    Anion gap 3 (L) 5 - 15  CBC with Differential     Status: Abnormal   Collection Time: 03/01/15  9:10 PM  Result Value Ref Range   WBC 3.1 (L) 3.6 - 11.0 K/uL   RBC 3.03 (L) 3.80 - 5.20 MIL/uL   Hemoglobin 8.1 (L) 12.0 - 16.0 g/dL   HCT 25.7 (L) 35.0 - 47.0 %   MCV 84.9 80.0 - 100.0 fL   MCH 26.8 26.0 - 34.0 pg   MCHC 31.6 (L) 32.0 - 36.0 g/dL   RDW 18.1 (H) 11.5 - 14.5 %   Platelets 189 150 - 440 K/uL   Neutrophils Relative % 67% %   Neutro Abs 2.1 1.4 - 6.5 K/uL   Lymphocytes Relative 19% %   Lymphs Abs 0.6 (L) 1.0 - 3.6 K/uL   Monocytes Relative 13% %   Monocytes Absolute 0.4 0.2 - 0.9 K/uL   Eosinophils Relative 0% %   Eosinophils Absolute 0.0 0 - 0.7 K/uL   Basophils Relative 1% %    Basophils Absolute 0.0 0 - 0.1 K/uL   Smear Review MORPHOLOGY UNREMARKABLE   Protime-INR     Status: Abnormal   Collection Time: 03/01/15  9:10 PM  Result Value Ref Range   Prothrombin Time 15.2 (H) 11.4 - 15.0 seconds   INR 1.18    Dg Shoulder Right  03/01/2015  CLINICAL DATA:  Right shoulder pain after fall. EXAM: RIGHT SHOULDER - 2+ VIEW COMPARISON:  None. FINDINGS: There is no evidence of fracture or dislocation. There is no evidence of arthropathy or other focal bone abnormality. Soft tissues are unremarkable. There is a right sided pneumothorax identified. This measures approximately 15%. No displaced rib fractures identified. IMPRESSION: 1. No evidence for shoulder dislocation or fracture. 2. Right-sided pneumothorax. Recommend further investigation with PA and lateral chest radiograph. Critical Value/emergent results were called by telephone at the time of interpretation on 03/01/2015 at 8:27 pm to Dr. Delman Kitten , who verbally acknowledged these results. Electronically Signed   By: Kerby Moors M.D.   On: 03/01/2015 20:27   Dg Chest Port 1 View  03/01/2015  CLINICAL DATA:  Right chest pain after fall today EXAM: PORTABLE CHEST 1 VIEW COMPARISON:  01/22/2015 FINDINGS: There is a large right pneumothorax, approximately 30% by volume. Left lung is well expanded and clear. Hilar and mediastinal contours are unremarkable. No displaced rib fractures are evident. IMPRESSION: Large right pneumothorax. These results were called by telephone at the time of interpretation on 03/01/2015 at 9:14 pm to Dr. Madaline Savage, who verbally acknowledged these results. Electronically Signed   By: Andreas Newport M.D.   On: 03/01/2015 21:14   Dg Hip Port Unilat With Pelvis 1v Left  03/01/2015  CLINICAL DATA:  Left hip pain following a fall today. EXAM: DG HIP (WITH OR WITHOUT PELVIS) 1V PORT LEFT COMPARISON:  None. FINDINGS: There is no evidence of hip fracture or dislocation. There is no evidence of arthropathy or other  focal bone abnormality. IMPRESSION: Negative. Electronically Signed   By: Lajean Manes M.D.   On: 03/01/2015 21:21    Review of Systems  Constitutional: Positive for weight loss and malaise/fatigue.  HENT: Negative.  Eyes: Negative.   Respiratory: Positive for cough and shortness of breath.   Cardiovascular: Positive for chest pain. Negative for palpitations and orthopnea.  Skin: Positive for rash.  Neurological: Positive for weakness.  All other systems reviewed and are negative.   Physical Exam  Constitutional: She is oriented to person, place, and time.  Severe cachexia weight approximately 50 pounds.  Eyes: Pupils are equal, round, and reactive to light. No scleral icterus.  Cardiovascular: Normal rate.   Pulmonary/Chest: Effort normal. No respiratory distress. She exhibits tenderness.  Neurological: She is oriented to person, place, and time.  Psychiatric: Memory, affect and judgment normal.    Data Reviewed I personally reviewed the film of the chest demonstrating a 30% right-sided pneumothorax.  I have personally reviewed the patient's imaging, laboratory findings and medical records.    Assessment    Traumatic right pneumothorax no signs of tension. Severe protein calorie malnutrition and severe cachexia HIV infection    Plan    A right chest tube will be placed. I recommend medical admission for the patient's surgery will follow.     Sherri Rad MD, FACS 03/01/2015, 10:36 PM

## 2015-03-01 NOTE — ED Provider Notes (Signed)
[facility] Emergency Department Provider Note REMINDER - THIS NOTE IS NOT A FINAL MEDICAL RECORD UNTIL IT IS SIGNED. UNTIL THEN, THE CONTENT BELOW MAY REFLECT INFORMATION FROM A DOCUMENTATION TEMPLATE, NOT THE ACTUAL PATIENT VISIT. ____________________________________________  Time seen: Approximately 7:56 PM  I have reviewed the triage vital signs and the nursing notes.   HISTORY  Chief Complaint Shoulder Injury    HPI Donna Hayden is a 44 y.o. female the history of HIV, anemia, pneumonia and pulmonary hypertension.  The patient states that today, rather this morning, while she is sitting in the chair she slipped and fell out onto her right side landing on her right shoulder and right chest. Since that time she's had pain over the right upper chest and right arm pit region. She describes a sharp pain is worse with deep breathing, and is associated with mild shortness of breath. No numbness or tingling. No neck pain, she did not pass out or hit her head. She reports no fevers or chills. She does have a sharp chest pain over the right side of the chest especially with deep inspiration and movement.  No previous history of lung surgery.   Past Medical History  Diagnosis Date  . HIV (human immunodeficiency virus infection) (HCC)   . Anemia     Iron Deficient  . Osteomyelitis (HCC) Bilateral Femoral  . Parotid cyst   . Pneumonia   . Pulmonary hypertension (HCC)   . Selective deficiency of IgG subclasses (HCC)   . Septic arthritis (HCC)   . Sickle cell trait (HCC)   . Shortness of breath dyspnea     Patient Active Problem List   Diagnosis Date Noted  . Pneumothorax     Past Surgical History  Procedure Laterality Date  . No past surgeries    . Shoulder arthroscopy      Current Outpatient Rx  Name  Route  Sig  Dispense  Refill  . azithromycin (ZITHROMAX) 200 MG/5ML suspension   Oral   Take 15 mLs by mouth once a week.      1   .  fluconazole (DIFLUCAN) 40 MG/ML suspension   Oral   Take 200 mg by mouth daily.       0   . folic acid (FOLVITE) 1 MG tablet   Oral   Take 1 mg by mouth daily.         . ISENTRESS 400 MG tablet            1     Dispense as written.   . nitrofurantoin (MACRODANTIN) 100 MG capsule   Oral   Take 1 capsule (100 mg total) by mouth 2 (two) times daily.   20 capsule   0   . NORVIR 80 MG/ML solution            1     Dispense as written.   Marland Kitchen PREZISTA 800 MG tablet            1     Dispense as written.   . sildenafil (REVATIO) 20 MG tablet   Oral   Take 20 mg by mouth 3 (three) times daily.         . TRUVADA 200-300 MG per tablet            1     Dispense as written.     Allergies Penicillins and Sulfa antibiotics  Family History  Problem Relation Age of Onset  . Diabetes  Mother   . Coarctation of the aorta Other     Social History Social History  Substance Use Topics  . Smoking status: Never Smoker   . Smokeless tobacco: Never Used  . Alcohol Use: No    Review of Systems Constitutional: No fever/chills Eyes: No visual changes. ENT: No sore throat. Cardiovascular: See history of present illness Respiratory: See history of present illness Gastrointestinal: No abdominal pain.  No nausea, no vomiting.  No diarrhea.  No constipation. Genitourinary: Negative for dysuria. Musculoskeletal: Negative for back pain. Skin: Negative for rash. Neurological: Negative for headaches, focal weakness or numbness.  She reports that she is very weak and tired and has lost a ton of weight over the last several years but is currently being treated for HIV which was discovered approximately a year ago.  10-point ROS otherwise negative.  ____________________________________________   PHYSICAL EXAM:  VITAL SIGNS: ED Triage Vitals  Enc Vitals Group     BP 03/01/15 1941 113/68 mmHg     Pulse Rate 03/01/15 1941 124     Resp 03/01/15 1941 18     Temp  03/01/15 1941 98.4 F (36.9 C)     Temp Source 03/01/15 1941 Oral     SpO2 03/01/15 1941 93 %     Weight 03/01/15 1941 56 lb (25.401 kg)     Height 03/01/15 1941 5' (1.524 m)     Head Cir --      Peak Flow --      Pain Score 03/01/15 1943 10     Pain Loc --      Pain Edu? --      Excl. in GC? --    Constitutional: Alert and oriented. Extremely cachectic, but in no acute distress. Eyes: Conjunctivae are normal. PERRL. EOMI. Head: Atraumatic. Nose: No congestion/rhinnorhea. Mouth/Throat: Mucous membranes are moist.  Oropharynx non-erythematous. Neck: No stridor.  No cervical spine tenderness Cardiovascular: Tachycardic rate, regular rhythm. Grossly normal heart sounds.  Good peripheral circulation. Respiratory: Normal respiratory effort.  No retractions. Lungs clear on the left, slightly diminished on the right. She does not have crepitance, JVD, or tracheal deviation. She does have moderate tenderness along the right anterior chest wall without obvious bruising. She does demonstrate pleuritic chest pain with deep inspiration on the right. Gastrointestinal: Soft and nontender. No distention. No abdominal bruits. No CVA tenderness. Musculoskeletal: No lower extremity tenderness nor edema except for some mild discomfort over the left lateral pelvis to palpation.  No joint effusions. No obvious deformities. The right shoulder ranges well without significant pain, left shoulder ranges well without significant pain or discomfort. Median ulnar and radial nerves normal and intact bilaterally. Neurologic:  Normal speech and language. No gross focal neurologic deficits are appreciated. Skin:  Skin is warm, dry and intact. No rash noted. Psychiatric: Mood and affect are normal. Speech and behavior are normal.  ____________________________________________   LABS (all labs ordered are listed, but only abnormal results are displayed)  Labs Reviewed  COMPREHENSIVE METABOLIC PANEL - Abnormal; Notable  for the following:    Sodium 132 (*)    Potassium 3.3 (*)    Calcium 8.3 (*)    Total Protein 11.3 (*)    Albumin 1.9 (*)    ALT 7 (*)    Alkaline Phosphatase 36 (*)    Anion gap 3 (*)    All other components within normal limits  CBC WITH DIFFERENTIAL/PLATELET - Abnormal; Notable for the following:    WBC 3.1 (*)  RBC 3.03 (*)    Hemoglobin 8.1 (*)    HCT 25.7 (*)    MCHC 31.6 (*)    RDW 18.1 (*)    Lymphs Abs 0.6 (*)    All other components within normal limits  PROTIME-INR - Abnormal; Notable for the following:    Prothrombin Time 15.2 (*)    All other components within normal limits   ____________________________________________  EKG   ____________________________________________  RADIOLOGY  DG HIP PORT UNILAT WITH PELVIS 1V LEFT (Final result) Result time: 03/01/15 21:21:50   Final result by Rad Results In Interface (03/01/15 21:21:50)   Narrative:   CLINICAL DATA: Left hip pain following a fall today.  EXAM: DG HIP (WITH OR WITHOUT PELVIS) 1V PORT LEFT  COMPARISON: None.  FINDINGS: There is no evidence of hip fracture or dislocation. There is no evidence of arthropathy or other focal bone abnormality.  IMPRESSION: Negative.   Electronically Signed By: Amie Portlandavid Ormond M.D. On: 03/01/2015 21:21          DG Chest Port 1 View (Final result) Result time: 03/01/15 21:14:20   Final result by Rad Results In Interface (03/01/15 21:14:20)   Narrative:   CLINICAL DATA: Right chest pain after fall today  EXAM: PORTABLE CHEST 1 VIEW  COMPARISON: 01/22/2015  FINDINGS: There is a large right pneumothorax, approximately 30% by volume. Left lung is well expanded and clear. Hilar and mediastinal contours are unremarkable. No displaced rib fractures are evident.  IMPRESSION: Large right pneumothorax. These results were called by telephone at the time of interpretation on 03/01/2015 at 9:14 pm to Dr. Claris GladdenGale, who verbally acknowledged these  results.   Electronically Signed By: Ellery Plunkaniel R Mitchell M.D. On: 03/01/2015 21:14          DG Shoulder Right (Final result) Result time: 03/01/15 20:27:31   Final result by Rad Results In Interface (03/01/15 20:27:31)   Narrative:   CLINICAL DATA: Right shoulder pain after fall.  EXAM: RIGHT SHOULDER - 2+ VIEW  COMPARISON: None.  FINDINGS: There is no evidence of fracture or dislocation. There is no evidence of arthropathy or other focal bone abnormality. Soft tissues are unremarkable. There is a right sided pneumothorax identified. This measures approximately 15%. No displaced rib fractures identified.  IMPRESSION: 1. No evidence for shoulder dislocation or fracture. 2. Right-sided pneumothorax. Recommend further investigation with PA and lateral chest radiograph. Critical Value/emergent results were called by telephone at the time of interpretation on 03/01/2015 at 8:27 pm to Dr. Sharyn CreamerMARK Kerstin Crusoe , who verbally acknowledged these results.     ____________________________________________   PROCEDURES  Procedure(s) performed: None  Critical Care performed: Yes, see critical care note(s)  CRITICAL CARE Performed by: Sharyn CreamerQUALE, Bristyn Kulesza   Total critical care time: 35 minutes  Patient presents with a symptomatic right-sided pneumothorax with mild dyspnea. She required emergent evaluation, close monitoring, consultation by general surgery and chest tube placement.  Critical care time was exclusive of separately billable procedures and treating other patients.  Critical care was necessary to treat or prevent imminent or life-threatening deterioration.  Critical care was time spent personally by me on the following activities: development of treatment plan with patient and/or surrogate as well as nursing, discussions with consultants, evaluation of patient's response to treatment, examination of patient, obtaining history from patient or surrogate, ordering and  performing treatments and interventions, ordering and review of laboratory studies, ordering and review of radiographic studies, pulse oximetry and re-evaluation of patient's condition.  ____________________________________________   INITIAL IMPRESSION / ASSESSMENT AND  PLAN / ED COURSE  Pertinent labs & imaging results that were available during my care of the patient were reviewed by me and considered in my medical decision making (see chart for details).  Patient presents after an accident a fall on her right side with associated chest pain and dyspnea. She is noted to be tachycardic, but also very cachectic and chronically ill-appearing. She does demonstrate evidence of pneumothorax on the right, approximately 30%. I consulted general surgery for evaluation, otherwise only other evidence of trauma is some mild tenderness over the left hemipelvis without evidence of deformity.  She has no acute infectious symptoms, it appears that this was asked to follow the chair. She did not strike her head, did not lose consciousness, and she reports no neck or back pain.  ----------------------------------------- 9:44 PM on 03/01/2015 -----------------------------------------  Dr. Colette Ribas currently with patient. Patient had chest tube placed.  ----------------------------------------- 11:07 PM on 03/01/2015 -----------------------------------------  Patient hemodynamics improved after chest tube placement. Surgical service to follow, patient being admitted to the medical service due to her complex medical history including HIV. ____________________________________________   FINAL CLINICAL IMPRESSION(S) / ED DIAGNOSES  Final diagnoses:  Traumatic pneumothorax, initial encounter      Sharyn Creamer, MD 03/01/15 2308

## 2015-03-01 NOTE — ED Notes (Signed)
Pt fell this morning and was down for approximately 30 minutes. Pt c/o R shoulder pain, fell on right shoulder. Pt denies other injury. Pt normally does not ambulate unassisted and is extremely cachectic secondary to her chronic illness.

## 2015-03-01 NOTE — ED Notes (Signed)
Pt place on non-rebreather, O2 increased to 100%

## 2015-03-02 ENCOUNTER — Inpatient Hospital Stay: Payer: Medicare Other

## 2015-03-02 DIAGNOSIS — J939 Pneumothorax, unspecified: Secondary | ICD-10-CM

## 2015-03-02 MED ORDER — AZITHROMYCIN 200 MG/5ML PO SUSR
600.0000 mg | ORAL | Status: DC
  Administered 2015-03-02 – 2015-03-08 (×2): 600 mg via ORAL
  Filled 2015-03-02 (×2): qty 15

## 2015-03-02 MED ORDER — FLUCONAZOLE 40 MG/ML PO SUSR: 200.0000 mg | Freq: Every day | ORAL | Status: DC

## 2015-03-02 MED ORDER — FOLIC ACID 1 MG PO TABS
1.0000 mg | ORAL_TABLET | Freq: Every day | ORAL | Status: DC
Start: 2015-03-02 — End: 2015-03-03
  Filled 2015-03-02: qty 1

## 2015-03-02 MED ORDER — NITROFURANTOIN MACROCRYSTAL 100 MG PO CAPS
100.0000 mg | ORAL_CAPSULE | Freq: Two times a day (BID) | ORAL | Status: DC
  Administered 2015-03-02: 01:00:00 100 mg via ORAL
  Filled 2015-03-02 (×5): qty 1

## 2015-03-02 MED ORDER — EMTRICITABINE-TENOFOVIR AF 200-25 MG PO TABS
1.0000 | ORAL_TABLET | Freq: Every day | ORAL | Status: DC
  Filled 2015-03-02 (×4): qty 1

## 2015-03-02 MED ORDER — FLUCONAZOLE 100 MG PO TABS
200.0000 mg | ORAL_TABLET | Freq: Every day | ORAL | Status: DC
  Administered 2015-03-02 – 2015-03-11 (×7): 200 mg via ORAL
  Filled 2015-03-02 (×3): qty 2
  Filled 2015-03-02: qty 1
  Filled 2015-03-02 (×7): qty 2

## 2015-03-02 MED ORDER — SILDENAFIL CITRATE 20 MG PO TABS
20.0000 mg | ORAL_TABLET | Freq: Three times a day (TID) | ORAL | Status: DC
  Filled 2015-03-02 (×6): qty 1

## 2015-03-02 MED ORDER — DARUNAVIR ETHANOLATE 800 MG PO TABS
800.0000 mg | ORAL_TABLET | Freq: Every day | ORAL | Status: DC
  Filled 2015-03-02 (×2): qty 1

## 2015-03-02 MED ORDER — RALTEGRAVIR POTASSIUM 400 MG PO TABS
400.0000 mg | ORAL_TABLET | Freq: Two times a day (BID) | ORAL | Status: DC
  Filled 2015-03-02 (×4): qty 1

## 2015-03-02 MED ORDER — POTASSIUM CHLORIDE CRYS ER 20 MEQ PO TBCR
40.0000 meq | EXTENDED_RELEASE_TABLET | Freq: Once | ORAL | Status: AC
  Administered 2015-03-02: 40 meq via ORAL
  Filled 2015-03-02: qty 2

## 2015-03-02 NOTE — Progress Notes (Signed)
   03/02/15 1000  Clinical Encounter Type  Visited With Patient and family together  Visit Type Initial  Referral From Nurse  Consult/Referral To Chaplain  Advance Directives (For Healthcare)  Does patient have an advance directive? No  Would patient like information on creating an advanced directive? Yes - Transport plannerducational materials given  Provided AD education and forms to patient, as well as pastoral presence and support.  Asbury Automotive GroupChaplain Shakenya Stoneberg-pager [phone]

## 2015-03-02 NOTE — Progress Notes (Signed)
Initial Nutrition Assessment  DOCUMENTATION CODES:   Severe malnutrition in context of chronic illness  INTERVENTION:  Meals and snacks: Cater to pt preferences, will add snacks Medical Nutrition Supplement Therapy: Will add carnation instant breakfast TID, pt prefers   NUTRITION DIAGNOSIS:   Malnutrition related to chronic illness as evidenced by severe depletion of body fat, severe depletion of muscle mass.    GOAL:   Patient will meet greater than or equal to 90% of their needs    MONITOR:    (Energy intake, Anthropometric)  REASON FOR ASSESSMENT:   Malnutrition Screening Tool, Consult Assessment of nutrition requirement/status  ASSESSMENT:      Pt admitted following fall, now with chest tube and pneumothorax Past Medical History  Diagnosis Date  . HIV (human immunodeficiency virus infection) (HCC)   . Anemia     Iron Deficient  . Osteomyelitis (HCC) Bilateral Femoral  . Parotid cyst   . Pneumonia   . Pulmonary hypertension (HCC)   . Selective deficiency of IgG subclasses (HCC)   . Septic arthritis (HCC)   . Sickle cell trait (HCC)   . Shortness of breath dyspnea     Current Nutrition: nothing eaten today per pt, saving sandwich for later, slept through breakfast  Food/Nutrition-Related History: reports she eats q 2 hr at home (small meals) carnation instant breakfast, smoothies, meals   Scheduled Medications:  . azithromycin  600 mg Oral Weekly  . Darunavir Ethanolate  800 mg Oral Q breakfast  . emtricitabine-tenofovir AF  1 tablet Oral Daily  . fluconazole  200 mg Oral Daily  . folic acid  1 mg Oral Daily  . heparin  5,000 Units Subcutaneous 3 times per day  . nitrofurantoin  100 mg Oral BID  . raltegravir  400 mg Oral BID  . sildenafil  20 mg Oral TID    Continuous Medications:  . sodium chloride 75 mL/hr at 03/02/15 1413     Electrolyte/Renal Profile and Glucose Profile:   Recent Labs Lab 03/01/15 2110  NA 132*  K 3.3*  CL 103   CO2 26  BUN 13  CREATININE 0.61  CALCIUM 8.3*  GLUCOSE 91   Protein Profile:  Recent Labs Lab 03/01/15 2110  ALBUMIN 1.9*    Gastrointestinal Profile: Last BM:11/4   Nutrition-Focused Physical Exam Findings: Nutrition-Focused physical exam completed. Findings are severe fat depletion, severe  muscle depletion, and none edema.      Weight Change: 56 pounds since 11/2014 per wt encounters    Diet Order:  Diet Heart Room service appropriate?: Yes; Fluid consistency:: Thin  Skin:   stage III pressure ulcer on buttock  Height:   Ht Readings from Last 1 Encounters:  03/01/15 5' (1.524 m)    Weight:   Wt Readings from Last 1 Encounters:  03/01/15 56 lb (25.401 kg)    Ideal Body Weight:     BMI:  Body mass index is 10.94 kg/(m^2).  Estimated Nutritional Needs:   Kcal:  BEE 1011 kcals (IF 1.0-1.2, AF 1.2) [id number] kcals/d.  Using IBW of 45kg  Protein:  (1.2-1.5 g/kg) 54-68 g/d Usign IbW of 45kg  Fluid:  (25-6230ml/kg) 1125-131150ml/d  EDUCATION NEEDS:   No education needs identified at this time  HIGH Care Level  Shiva Sahagian B. Freida BusmanAllen, RD, LDN [phone] (pager)

## 2015-03-02 NOTE — Plan of Care (Signed)
Problem: Education: Goal: Knowledge of [place] [facility] Education information/materials will improve Outcome: Progressing Tylenol given once for pain level 4 in chest tube insertion site with good effect.  Problem: Safety: Goal: Ability to remain free from injury will improve Outcome: Progressing Toileting needs assessed qx1hr with safety checks. Pt compliant with safety interventions and uses the call bell to express needs.  Problem: Physical Regulation: Goal: Ability to maintain clinical measurements within normal limits will improve Outcome: Progressing VSS. SBP in the 90's- Revatio 1000 dose held, continue IVF. Lungs sounds clear. Chest tube to suction intact.  Problem: Skin Integrity: Goal: Risk for impaired skin integrity will decrease Outcome: Progressing Dressings dry and intact. Repositioned pt qx2hrs.   Problem: Nutrition: Goal: Adequate nutrition will be maintained Outcome: Not Progressing Poor appetite, dietitian consult ordered.

## 2015-03-02 NOTE — Progress Notes (Signed)
Spot checked patients oxygen for 10 minutes no shortrness of breath, clear breath sounds

## 2015-03-02 NOTE — Plan of Care (Signed)
Problem: Nutrition: Goal: Adequate nutrition will be maintained Outcome: Not Progressing Poor appetite. Dietitian consult ordered.

## 2015-03-02 NOTE — Progress Notes (Signed)
CC pneumothorax Subjective: Chest tube placed last night by Dr. Egbert GaribaldiBird for pneumothorax from a traumatic etiology. She states she feels much better without much pain and no shortness of breath  Objective: Vital signs in last 24 hours: Temp:  [98 F (36.7 C)-98.4 F (36.9 C)] 98.2 F (36.8 C) (11/05 0538) Pulse Rate:  [83-124] 95 (11/05 0941) Resp:  [13-34] 22 (11/04 2345) BP: (96-119)/(54-83) 98/58 mmHg (11/05 0944) SpO2:  [93 %-100 %] 99 % (11/05 0941) FiO2 (%):  [100 %] 100 % (11/05 0337) Weight:  [56 lb (25.401 kg)] 56 lb (25.401 kg) (11/04 1941) Last BM Date: 03/01/15  Intake/Output from previous day: 11/04 0701 - 11/05 0700 In: 322 [I.V.:322] Out: -  Intake/Output this shift:    Physical exam:  Small air leak present only on deep cough. CTA RR Thin emaciated patient.  Lab Results: CBC   Recent Labs  03/01/15 2110  WBC 3.1*  HGB 8.1*  HCT 25.7*  PLT 189   BMET  Recent Labs  03/01/15 2110  NA 132*  K 3.3*  CL 103  CO2 26  GLUCOSE 91  BUN 13  CREATININE 0.61  CALCIUM 8.3*   PT/INR  Recent Labs  03/01/15 2110  LABPROT 15.2*  INR 1.18   ABG No results for input(s): PHART, HCO3 in the last 72 hours.  Invalid input(s): PCO2, PO2  Studies/Results: Dg Shoulder Right  03/01/2015  CLINICAL DATA:  Right shoulder pain after fall. EXAM: RIGHT SHOULDER - 2+ VIEW COMPARISON:  None. FINDINGS: There is no evidence of fracture or dislocation. There is no evidence of arthropathy or other focal bone abnormality. Soft tissues are unremarkable. There is a right sided pneumothorax identified. This measures approximately 15%. No displaced rib fractures identified. IMPRESSION: 1. No evidence for shoulder dislocation or fracture. 2. Right-sided pneumothorax. Recommend further investigation with PA and lateral chest radiograph. Critical Value/emergent results were called by telephone at the time of interpretation on 03/01/2015 at 8:27 pm to Dr. Sharyn CreamerMARK QUALE , who verbally  acknowledged these results. Electronically Signed   By: Signa Kellaylor  Stroud M.D.   On: 03/01/2015 20:27   Dg Chest Port 1 View  03/01/2015  CLINICAL DATA:  Status post right chest tube placement for pneumothorax. EXAM: PORTABLE CHEST 1 VIEW COMPARISON:  03/01/2015 at 2040 hours FINDINGS: Pigtail chest tube has a placed on the right. The tip curls at the right apex. The pneumothorax has been partly evacuated. A small pneumothorax persists along the right lateral mid to upper lung. Lungs are clear.  No pleural effusion.  No left pneumothorax. IMPRESSION: 1. Status post right-sided chest tube placement with significant re-expansion of the right lung. There is a small residual pneumothorax along the right lateral mid to upper hemi thorax. Electronically Signed   By: Amie Portlandavid  Ormond M.D.   On: 03/01/2015 23:38   Dg Chest Port 1 View  03/01/2015  CLINICAL DATA:  Right chest pain after fall today EXAM: PORTABLE CHEST 1 VIEW COMPARISON:  01/22/2015 FINDINGS: There is a large right pneumothorax, approximately 30% by volume. Left lung is well expanded and clear. Hilar and mediastinal contours are unremarkable. No displaced rib fractures are evident. IMPRESSION: Large right pneumothorax. These results were called by telephone at the time of interpretation on 03/01/2015 at 9:14 pm to Dr. Claris GladdenGale, who verbally acknowledged these results. Electronically Signed   By: Ellery Plunkaniel R Mitchell M.D.   On: 03/01/2015 21:14   Dg Hip Port Unilat With Pelvis 1v Left  03/01/2015  CLINICAL DATA:  Left hip pain following a fall today. EXAM: DG HIP (WITH OR WITHOUT PELVIS) 1V PORT LEFT COMPARISON:  None. FINDINGS: There is no evidence of hip fracture or dislocation. There is no evidence of arthropathy or other focal bone abnormality. IMPRESSION: Negative. Electronically Signed   By: Amie Portland M.D.   On: 03/01/2015 21:21    Anti-infectives: Anti-infectives    Start     Dose/Rate Route Frequency Ordered Stop   03/02/15 1000   emtricitabine-tenofovir AF (DESCOVY) 200-25 MG per tablet 1 tablet     1 tablet Oral Daily 03/02/15 0042     03/02/15 1000  fluconazole (DIFLUCAN) 40 MG/ML suspension 200 mg  Status:  Discontinued     200 mg Oral Daily 03/02/15 0042 03/02/15 0046   03/02/15 1000  fluconazole (DIFLUCAN) tablet 200 mg     200 mg Oral Daily 03/02/15 0046     03/02/15 0800  Darunavir Ethanolate (PREZISTA) tablet 800 mg     800 mg Oral Daily with breakfast 03/02/15 0042     03/02/15 0045  raltegravir (ISENTRESS) tablet 400 mg     400 mg Oral 2 times daily 03/02/15 0042     03/02/15 0045  azithromycin (ZITHROMAX) 200 MG/5ML suspension 600 mg     600 mg Oral Weekly 03/02/15 0042        Assessment/Plan:  Will order chest x-ray for this afternoon to ensure that lung is completely inflated. Leave chest tube on suction at this point.  Lattie Haw, MD, FACS  03/02/2015

## 2015-03-02 NOTE — Plan of Care (Signed)
Problem: Nutrition: Goal: Adequate nutrition will be maintained Outcome: Progressing Plan of care, progress to goals. 1. Pt admitted status post fall and right pneumothorax with chest tube.  Oriented to unit.  Pain scale used to identify pain and pt reports pain of 4/10. 2. High Fall Risk. Discussed need to call for assistance. Pt verbalizes understanding. 3. Pt has a stage 3 pressure ulcer to her left buttock.  Applied foam dressing and will off load, turn q 2. 4. Recent weight loss.  Recommended pt take pills with milk . 5. Anticipate home with dtr at discharge. Henriette CombsSarah Madyx Delfin RN

## 2015-03-02 NOTE — Progress Notes (Signed)
[facility] [place] at [facility]   PATIENT NAME: Donna LarssonStephanie Hayden    MR#:  [id number]  DATE OF BIRTH:  [birth year]  SUBJECTIVE:  CHIEF COMPLAINT:   Chief Complaint  Patient presents with  . Shoulder Injury   right-sided chest pain while moving, no cough or shortness of breath.  REVIEW OF SYSTEMS:  CONSTITUTIONAL: No fever, fatigue or weakness.  EYES: No blurred or double vision.  EARS, NOSE, AND THROAT: No tinnitus or ear pain.  RESPIRATORY: No cough, shortness of breath, wheezing or hemoptysis.  CARDIOVASCULAR: No chest pain, orthopnea, edema.  GASTROINTESTINAL: No nausea, vomiting, diarrhea or abdominal pain.  GENITOURINARY: No dysuria, hematuria.  ENDOCRINE: No polyuria, nocturia,  HEMATOLOGY: No anemia, easy bruising or bleeding SKIN: No rash or lesion. MUSCULOSKELETAL: Tenderness on the right shoulder.   NEUROLOGIC: No tingling, numbness, weakness.  PSYCHIATRY: No anxiety or depression.   DRUG ALLERGIES:   Allergies  Allergen Reactions  . Penicillins Hives  . Sulfa Antibiotics Nausea Only and Rash    Rash and nausea. No breathing issues    VITALS:  Blood pressure 92/55, pulse 85, temperature 97.6 F (36.4 C), temperature source Oral, resp. rate 22, height 5' (1.524 m), weight 25.401 kg (56 lb), SpO2 100 %.  PHYSICAL EXAMINATION:  GENERAL:  44 y.o.-year-old patient lying in the bed with no acute distress. Chronically ill-appearing. Severe malnutrition. EYES: Pupils equal, round, reactive to light and accommodation. No scleral icterus. Extraocular muscles intact.  HEENT: Head atraumatic, normocephalic. Oropharynx and nasopharynx clear.  NECK:  Supple, no jugular venous distention. No thyroid enlargement, no tenderness.  LUNGS: Normal breath sounds bilaterally, no wheezing, rales,rhonchi or crepitation. No use of accessory muscles of respiration.  CARDIOVASCULAR: S1, S2 normal. No murmurs, rubs, or gallops.  ABDOMEN: Soft, nontender,  nondistended. Bowel sounds present. No organomegaly or mass.  EXTREMITIES: No pedal edema, cyanosis, or clubbing.  NEUROLOGIC: Cranial nerves II through XII are intact. Muscle strength 5/5 in all extremities. Sensation intact. Gait not checked.  PSYCHIATRIC: The patient is alert and oriented x 3.  SKIN: No obvious rash, lesion, or ulcer.    LABORATORY PANEL:   CBC  Recent Labs Lab 03/01/15 2110  WBC 3.1*  HGB 8.1*  HCT 25.7*  PLT 189   ------------------------------------------------------------------------------------------------------------------  Chemistries   Recent Labs Lab 03/01/15 2110  NA 132*  K 3.3*  CL 103  CO2 26  GLUCOSE 91  BUN 13  CREATININE 0.61  CALCIUM 8.3*  AST 29  ALT 7*  ALKPHOS 36*  BILITOT 0.7   ------------------------------------------------------------------------------------------------------------------  Cardiac Enzymes No results for input(s): TROPONINI in the last 168 hours. ------------------------------------------------------------------------------------------------------------------  RADIOLOGY:  Dg Shoulder Right  03/01/2015  CLINICAL DATA:  Right shoulder pain after fall. EXAM: RIGHT SHOULDER - 2+ VIEW COMPARISON:  None. FINDINGS: There is no evidence of fracture or dislocation. There is no evidence of arthropathy or other focal bone abnormality. Soft tissues are unremarkable. There is a right sided pneumothorax identified. This measures approximately 15%. No displaced rib fractures identified. IMPRESSION: 1. No evidence for shoulder dislocation or fracture. 2. Right-sided pneumothorax. Recommend further investigation with PA and lateral chest radiograph. Critical Value/emergent results were called by telephone at the time of interpretation on 03/01/2015 at 8:27 pm to Dr. Sharyn CreamerMARK QUALE , who verbally acknowledged these results. Electronically Signed   By: Signa Kellaylor  Stroud M.D.   On: 03/01/2015 20:27   Dg Chest Port 1 View  03/02/2015   CLINICAL DATA:  Followup pneumothorax EXAM: PORTABLE CHEST  1 VIEW COMPARISON:  03/01/2015 FINDINGS: Stable position of right chest tube. No significant pneumothorax visible. Heart size is normal. No pleural effusion or edema. No airspace consolidation. IMPRESSION: 1. Right-sided chest tube in place with no visible pneumothorax identified at this time. Electronically Signed   By: Signa Kell M.D.   On: 03/02/2015 12:14   Dg Chest Port 1 View  03/01/2015  CLINICAL DATA:  Status post right chest tube placement for pneumothorax. EXAM: PORTABLE CHEST 1 VIEW COMPARISON:  03/01/2015 at 2040 hours FINDINGS: Pigtail chest tube has a placed on the right. The tip curls at the right apex. The pneumothorax has been partly evacuated. A small pneumothorax persists along the right lateral mid to upper lung. Lungs are clear.  No pleural effusion.  No left pneumothorax. IMPRESSION: 1. Status post right-sided chest tube placement with significant re-expansion of the right lung. There is a small residual pneumothorax along the right lateral mid to upper hemi thorax. Electronically Signed   By: Amie Portland M.D.   On: 03/01/2015 23:38   Dg Chest Port 1 View  03/01/2015  CLINICAL DATA:  Right chest pain after fall today EXAM: PORTABLE CHEST 1 VIEW COMPARISON:  01/22/2015 FINDINGS: There is a large right pneumothorax, approximately 30% by volume. Left lung is well expanded and clear. Hilar and mediastinal contours are unremarkable. No displaced rib fractures are evident. IMPRESSION: Large right pneumothorax. These results were called by telephone at the time of interpretation on 03/01/2015 at 9:14 pm to Dr. Claris Gladden, who verbally acknowledged these results. Electronically Signed   By: Ellery Plunk M.D.   On: 03/01/2015 21:14   Dg Hip Port Unilat With Pelvis 1v Left  03/01/2015  CLINICAL DATA:  Left hip pain following a fall today. EXAM: DG HIP (WITH OR WITHOUT PELVIS) 1V PORT LEFT COMPARISON:  None. FINDINGS: There is no  evidence of hip fracture or dislocation. There is no evidence of arthropathy or other focal bone abnormality. IMPRESSION: Negative. Electronically Signed   By: Amie Portland M.D.   On: 03/01/2015 21:21    EKG:  No orders found for this or any previous visit.  ASSESSMENT AND PLAN:   1, Right-sided pneumothorax.  continue chest tube, follow-up with surgeon. 2. Hyponatremia. Continue normal saline IV, follow-up BMP. 3. Hypokalemia replaced, follow-up BMP.  4. HIV, continue home medications  *Severe malnutrition. Dietitian consult. *Anemia of chronic disease. Stable. * Neutropenia. Stable.   All the records are reviewed and case discussed with Care Management/Social Workerr. Management plans discussed with the patient, family and they are in agreement.  CODE STATUS: Full code  TOTAL TIME TAKING CARE OF THIS PATIENT: .   POSSIBLE D/C IN 2 DAYS, DEPENDING ON CLINICAL CONDITION.   Shaune Pollack M.D on 03/02/2015 at 2:53 PM  Between 7am to 6pm - Pager - [phone]  After 6pm go to [email or web address] - password [email or web address] [facility]  [place] [facility]  Office  [phone]  CC: Primary care physician; WOODS, Garnette Czech, MD

## 2015-03-03 ENCOUNTER — Inpatient Hospital Stay: Payer: Medicare Other

## 2015-03-03 DIAGNOSIS — E43 Unspecified severe protein-calorie malnutrition: Secondary | ICD-10-CM

## 2015-03-03 DIAGNOSIS — L8990 Pressure ulcer of unspecified site, unspecified stage: Secondary | ICD-10-CM

## 2015-03-03 LAB — BASIC METABOLIC PANEL
Anion gap: 2 — ABNORMAL LOW (ref 5–15)
BUN: 11 mg/dL (ref 6–20)
CHLORIDE: 109 mmol/L (ref 101–111)
CO2: 22 mmol/L (ref 22–32)
CREATININE: 0.54 mg/dL (ref 0.44–1.00)
Calcium: 7.3 mg/dL — ABNORMAL LOW (ref 8.9–10.3)
GFR calc Af Amer: >60 mL/min (ref >60)
GFR calc non Af Amer: >60 mL/min (ref >60)
GLUCOSE: 84 mg/dL (ref 65–99)
POTASSIUM: 3.2 mmol/L — AB (ref 3.5–5.1)
SODIUM: 133 mmol/L — AB (ref 135–145)

## 2015-03-03 LAB — CBC
HEMATOCRIT: 23 % — AB (ref 35.0–47.0)
Hemoglobin: 7.2 g/dL — ABNORMAL LOW (ref 12.0–16.0)
MCH: 26.8 pg (ref 26.0–34.0)
MCHC: 31.2 g/dL — AB (ref 32.0–36.0)
MCV: 85.9 fL (ref 80.0–100.0)
PLATELETS: 188 10*3/uL (ref 150–440)
RBC: 2.67 MIL/uL — ABNORMAL LOW (ref 3.80–5.20)
RDW: 18.4 % — AB (ref 11.5–14.5)
WBC: 2.4 10*3/uL — AB (ref 3.6–11.0)

## 2015-03-03 LAB — MAGNESIUM: Magnesium: 1.4 mg/dL — ABNORMAL LOW (ref 1.7–2.4)

## 2015-03-03 MED ORDER — POTASSIUM CHLORIDE CRYS ER 20 MEQ PO TBCR
40.0000 meq | EXTENDED_RELEASE_TABLET | Freq: Once | ORAL | Status: AC
  Administered 2015-03-03: 40 meq via ORAL
  Filled 2015-03-03: qty 2

## 2015-03-03 MED ORDER — MAGNESIUM SULFATE 2 GM/50ML IV SOLN
2.0000 g | Freq: Once | INTRAVENOUS | Status: AC
  Administered 2015-03-03: 08:00:00 2 g via INTRAVENOUS
  Filled 2015-03-03: qty 50

## 2015-03-03 NOTE — Plan of Care (Signed)
Problem: Education: Goal: Knowledge of [facility] Education information/materials will improve Outcome: Completed/Met Date Met:  03/03/15 Educated pt about pain rating scale and available pain meds. Pt verbalized understanding.  Problem: Safety: Goal: Ability to remain free from injury will improve Outcome: Progressing High fall risk intervention maintained. Pt remained free of injury. Pt uses the call bed.  Problem: Pain Managment: Goal: General experience of comfort will improve Outcome: Progressing Pt denies pain throughout the shift.  Problem: Physical Regulation: Goal: Ability to maintain clinical measurements within normal limits will improve Outcome: Progressing Noted pt on room air, O2 sats 89%-90%, HR 133, BP stable.Pt denied any distress at this time. 3L O2 per [place] applied, O2 sats up to 100%, HR 126. Dr Bridgett Larsson notified, no new orders. R chest tube to water seal. 57m Serous drainage via chest tube. Dressing dry and intact.   Problem: Skin Integrity: Goal: Risk for impaired skin integrity will decrease Outcome: Progressing Repositioned pt qx2hrs. Heels floated. Dressing changed.  Problem: Nutrition: Goal: Adequate nutrition will be maintained Outcome: Progressing Pt consumed 50% of breakfast. Sips and bites for lunch. Appetite slightly better.

## 2015-03-03 NOTE — Progress Notes (Signed)
[facility] [place] at [facility]   PATIENT NAME: Donna LarssonStephanie Hayden    MR#:  [id number]  DATE OF BIRTH:  Sep 09, 1970  SUBJECTIVE:  CHIEF COMPLAINT:   Chief Complaint  Patient presents with  . Shoulder Injury   no complaint.  REVIEW OF SYSTEMS:  CONSTITUTIONAL: No fever, fatigue or weakness.  EYES: No blurred or double vision.  EARS, NOSE, AND THROAT: No tinnitus or ear pain.  RESPIRATORY: No cough, shortness of breath, wheezing or hemoptysis.  CARDIOVASCULAR: No chest pain, orthopnea, edema.  GASTROINTESTINAL: No nausea, vomiting, diarrhea or abdominal pain.  GENITOURINARY: No dysuria, hematuria.  ENDOCRINE: No polyuria, nocturia,  HEMATOLOGY: No anemia, easy bruising or bleeding SKIN: No rash or lesion. MUSCULOSKELETAL: Tenderness on the right shoulder.   NEUROLOGIC: No tingling, numbness, weakness.  PSYCHIATRY: No anxiety or depression.   DRUG ALLERGIES:   Allergies  Allergen Reactions  . Penicillins Hives  . Sulfa Antibiotics Nausea Only and Rash    Rash and nausea. No breathing issues    VITALS:  Blood pressure 109/62, pulse 81, temperature 98.9 F (37.2 C), temperature source Oral, resp. rate 20, height 5' (1.524 m), weight 25.401 kg (56 lb), SpO2 100 %.  PHYSICAL EXAMINATION:  GENERAL:  44 y.o.-year-old patient lying in the bed with no acute distress. Chronically ill-appearing. Severe malnutrition. EYES: Pupils equal, round, reactive to light and accommodation. No scleral icterus. Extraocular muscles intact.  HEENT: Head atraumatic, normocephalic. Oropharynx and nasopharynx clear.  NECK:  Supple, no jugular venous distention. No thyroid enlargement, no tenderness.  LUNGS: Normal breath sounds bilaterally, no wheezing, rales,rhonchi or crepitation. No use of accessory muscles of respiration. No air leak from chest tube suction. CARDIOVASCULAR: S1, S2 normal. No murmurs, rubs, or gallops.  ABDOMEN: Soft, nontender, nondistended. Bowel  sounds present. No organomegaly or mass.  EXTREMITIES: No pedal edema, cyanosis, or clubbing.  NEUROLOGIC: Cranial nerves II through XII are intact. Muscle strength 5/5 in all extremities. Sensation intact. Gait not checked.  PSYCHIATRIC: The patient is alert and oriented x 3.  SKIN: No obvious rash, lesion, or ulcer.    LABORATORY PANEL:   CBC  Recent Labs Lab 03/03/15 0335  WBC 2.4*  HGB 7.2*  HCT 23.0*  PLT 188   ------------------------------------------------------------------------------------------------------------------  Chemistries   Recent Labs Lab 03/01/15 2110 03/03/15 0335  NA 132* 133*  K 3.3* 3.2*  CL 103 109  CO2 26 22  GLUCOSE 91 84  BUN 13 11  CREATININE 0.61 0.54  CALCIUM 8.3* 7.3*  MG  --  1.4*  AST 29  --   ALT 7*  --   ALKPHOS 36*  --   BILITOT 0.7  --    ------------------------------------------------------------------------------------------------------------------  Cardiac Enzymes No results for input(s): TROPONINI in the last 168 hours. ------------------------------------------------------------------------------------------------------------------  RADIOLOGY:  Dg Shoulder Right  03/01/2015  CLINICAL DATA:  Right shoulder pain after fall. EXAM: RIGHT SHOULDER - 2+ VIEW COMPARISON:  None. FINDINGS: There is no evidence of fracture or dislocation. There is no evidence of arthropathy or other focal bone abnormality. Soft tissues are unremarkable. There is a right sided pneumothorax identified. This measures approximately 15%. No displaced rib fractures identified. IMPRESSION: 1. No evidence for shoulder dislocation or fracture. 2. Right-sided pneumothorax. Recommend further investigation with PA and lateral chest radiograph. Critical Value/emergent results were called by telephone at the time of interpretation on 03/01/2015 at 8:27 pm to Dr. Sharyn CreamerMARK QUALE , who verbally acknowledged these results. Electronically Signed   By: Signa Kellaylor  Stroud  M.D.    On: 03/01/2015 20:27   Dg Chest Port 1 View  03/03/2015  CLINICAL DATA:  44 year old female with history of pneumothorax. EXAM: PORTABLE CHEST 1 VIEW COMPARISON:  Chest x-ray 03/02/2015. FINDINGS: Right-sided small bore chest tube with pigtail in the apex of the right upper lobe. Tiny lucency adjacent to the tip of the catheter likely reflects a tiny residual apical pneumothorax (less than 2% of the volume of the right hemithorax). Diffuse peribronchial cuffing, similar prior examinations. More pronounced peribronchovascular opacities in the left lower lobe may suggest a developing bronchopneumonia. No pleural effusions. No evidence of pulmonary edema. Heart size is normal. The patient is rotated to the left on today's exam, resulting in distortion of the mediastinal contours and reduced diagnostic sensitivity and specificity for mediastinal pathology. IMPRESSION: 1. Right-sided chest tube is stable in position. There is only a trace residual right apical pneumothorax noted on today's examination. 2. Diffuse bronchial wall thickening similar to recent prior examinations, suggestive of bronchitis. More severe peribronchovascular opacities in the left lower lobe may reflect a developing bronchopneumonia. Electronically Signed   By: Trudie Reed M.D.   On: 03/03/2015 08:09   Dg Chest Port 1 View  03/02/2015  CLINICAL DATA:  Followup pneumothorax EXAM: PORTABLE CHEST 1 VIEW COMPARISON:  03/01/2015 FINDINGS: Stable position of right chest tube. No significant pneumothorax visible. Heart size is normal. No pleural effusion or edema. No airspace consolidation. IMPRESSION: 1. Right-sided chest tube in place with no visible pneumothorax identified at this time. Electronically Signed   By: Signa Kell M.D.   On: 03/02/2015 12:14   Dg Chest Port 1 View  03/01/2015  CLINICAL DATA:  Status post right chest tube placement for pneumothorax. EXAM: PORTABLE CHEST 1 VIEW COMPARISON:  03/01/2015 at 2040 hours FINDINGS:  Pigtail chest tube has a placed on the right. The tip curls at the right apex. The pneumothorax has been partly evacuated. A small pneumothorax persists along the right lateral mid to upper lung. Lungs are clear.  No pleural effusion.  No left pneumothorax. IMPRESSION: 1. Status post right-sided chest tube placement with significant re-expansion of the right lung. There is a small residual pneumothorax along the right lateral mid to upper hemi thorax. Electronically Signed   By: Amie Portland M.D.   On: 03/01/2015 23:38   Dg Chest Port 1 View  03/01/2015  CLINICAL DATA:  Right chest pain after fall today EXAM: PORTABLE CHEST 1 VIEW COMPARISON:  01/22/2015 FINDINGS: There is a large right pneumothorax, approximately 30% by volume. Left lung is well expanded and clear. Hilar and mediastinal contours are unremarkable. No displaced rib fractures are evident. IMPRESSION: Large right pneumothorax. These results were called by telephone at the time of interpretation on 03/01/2015 at 9:14 pm to Dr. Claris Gladden, who verbally acknowledged these results. Electronically Signed   By: Ellery Plunk M.D.   On: 03/01/2015 21:14   Dg Hip Port Unilat With Pelvis 1v Left  03/01/2015  CLINICAL DATA:  Left hip pain following a fall today. EXAM: DG HIP (WITH OR WITHOUT PELVIS) 1V PORT LEFT COMPARISON:  None. FINDINGS: There is no evidence of hip fracture or dislocation. There is no evidence of arthropathy or other focal bone abnormality. IMPRESSION: Negative. Electronically Signed   By: Amie Portland M.D.   On: 03/01/2015 21:21    EKG:  No orders found for this or any previous visit.  ASSESSMENT AND PLAN:   1, Right-sided pneumothorax.  continue chest tube, follow-up with surgeon. 2.  Hyponatremia. Better, Continue normal saline IV, follow-up BMP. 3. Hypokalemia.  KCl,  follow-up BMP.  * Hypomagnesemia. Give IV magnesium and follow-up level.  4. HIV, per pharmacist, the patient is not on HIV medication, discontinue current  HIV medications  Continue home medications Diflucan and Zithromax.  *Severe malnutrition. Follow-up Dietitian consult. *Anemia of chronic disease. Stable. Hemoglobin decreased from 8.1 to 7.2, follow-up CBC. * Neutropenia. Stable.   All the records are reviewed and case discussed with Care Management/Social Workerr. Management plans discussed with the patient, her daughter and they are in agreement. Greater than 50% time was spent on coordination of care and face-to-face counseling.  CODE STATUS: Full code  TOTAL TIME TAKING CARE OF THIS PATIENT: 35 minutes.   POSSIBLE D/C IN 2 DAYS, DEPENDING ON CLINICAL CONDITION.   Shaune Pollack M.D on 03/03/2015 at 2:04 PM  Between 7am to 6pm - Pager - [phone]  After 6pm go to [email or web address] - password [email or web address] [facility]  [place] [facility]  Office  [phone]  CC: Primary care physician; WOODS, Garnette Czech, MD

## 2015-03-03 NOTE — Progress Notes (Signed)
PT Cancellation Note  Patient Details Name: Pricilla LarssonStephanie Akram MRN: [id number] DOB: Sep 30, 1970   Cancelled Treatment:    Reason Eval/Treat Not Completed: Medical issues which prohibited therapy; Patient's hemaglobin is 7.2. She is contraindicated for PT at this time. Will re-attempt PT eval once patient is appropriate.   Hopkins,Margaret PT, DPT 03/03/2015, 3:48 PM

## 2015-03-03 NOTE — Progress Notes (Signed)
Pharmacy Note - Medication Reconciliation  Spoke with patient and received rx records from [facility]. According to patient and pharmacy records, the only medications she is currently taking are fluconazole and azithromycin. Medication reconciliation updated accordingly.  Discussed with Dr. Imogene Burnhen, will discontinue other medications from previous med rec.   Donna Hayden, PharmD Clinical Pharmacist  03/03/2015 12:10 PM

## 2015-03-04 ENCOUNTER — Inpatient Hospital Stay: Payer: Medicare Other

## 2015-03-04 LAB — BASIC METABOLIC PANEL
Anion gap: 5 (ref 5–15)
BUN: 7 mg/dL (ref 6–20)
CHLORIDE: 106 mmol/L (ref 101–111)
CO2: 19 mmol/L — AB (ref 22–32)
CREATININE: 0.45 mg/dL (ref 0.44–1.00)
Calcium: 7.3 mg/dL — ABNORMAL LOW (ref 8.9–10.3)
GFR calc Af Amer: >60 mL/min (ref >60)
GFR calc non Af Amer: >60 mL/min (ref >60)
Glucose, Bld: 82 mg/dL (ref 65–99)
Potassium: 2.8 mmol/L — CL (ref 3.5–5.1)
Sodium: 130 mmol/L — ABNORMAL LOW (ref 135–145)

## 2015-03-04 LAB — CBC
HCT: 22.7 % — ABNORMAL LOW (ref 35.0–47.0)
Hemoglobin: 7.2 g/dL — ABNORMAL LOW (ref 12.0–16.0)
MCH: 26.7 pg (ref 26.0–34.0)
MCHC: 31.7 g/dL — ABNORMAL LOW (ref 32.0–36.0)
MCV: 84.4 fL (ref 80.0–100.0)
PLATELETS: 208 10*3/uL (ref 150–440)
RBC: 2.69 MIL/uL — AB (ref 3.80–5.20)
RDW: 18.2 % — ABNORMAL HIGH (ref 11.5–14.5)
WBC: 4.2 10*3/uL (ref 3.6–11.0)

## 2015-03-04 LAB — MAGNESIUM: Magnesium: 1.6 mg/dL — ABNORMAL LOW (ref 1.7–2.4)

## 2015-03-04 MED ORDER — POTASSIUM CHLORIDE 10 MEQ/100ML IV SOLN
10.0000 meq | INTRAVENOUS | Status: AC
  Administered 2015-03-04 (×4): 10 meq via INTRAVENOUS
  Filled 2015-03-04 (×4): qty 100

## 2015-03-04 MED ORDER — POTASSIUM CHLORIDE CRYS ER 20 MEQ PO TBCR
40.0000 meq | EXTENDED_RELEASE_TABLET | Freq: Once | ORAL | Status: AC
  Administered 2015-03-04: 40 meq via ORAL
  Filled 2015-03-04: qty 2

## 2015-03-04 MED ORDER — MAGNESIUM SULFATE 2 GM/50ML IV SOLN
2.0000 g | Freq: Once | INTRAVENOUS | Status: AC
  Administered 2015-03-04: 09:00:00 2 g via INTRAVENOUS
  Filled 2015-03-04: qty 50

## 2015-03-04 MED ORDER — DAPSONE 100 MG PO TABS
100.0000 mg | ORAL_TABLET | Freq: Every day | ORAL | Status: DC
  Administered 2015-03-04 – 2015-03-11 (×5): 100 mg via ORAL
  Filled 2015-03-04 (×9): qty 1

## 2015-03-04 NOTE — Care Management (Signed)
Lorenza CambridgeJulie Willets called back from [facility] [phone]), States that Oletta LamasBeth Childs, Social Worker, at [facility] services has been working on services for Ms. Nunzio CoryLyons [phone]) Will speak with Ms. Marjo Bickerhilds about plans Gwenette GreetBrenda S Serenidy Waltz RN MSN CCM Care Management [phone]

## 2015-03-04 NOTE — Clinical Documentation Improvement (Signed)
Internal Medicine  Can the diagnosis of HIV be further specified?   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  The CDC defines AIDS as present in an HIV positive patient who has or has had any of the following:  - Current or prior CD4+ T-lymphocyte count <200 cells/uL  - Current or prior CD4+ T-lymphocyte count < 14% of total lymphocytes    - Current or prior diagnosis of an AIDS-defining condition     Supporting Information: -- H&P states "advanced HIV"  Please exercise your independent, professional judgment when responding. A specific answer is not anticipated or expected.  Thank You,  Beverley FiedlerLaurie E Sulema Braid RN CDI Health Information Management [place] [phone]

## 2015-03-04 NOTE — Care Management Important Message (Signed)
Important Message  Patient Details  Name: Donna LarssonStephanie Belgarde MRN: [id number] Date of Birth: 03-27-1971   Medicare Important Message Given:  Yes-second notification given    Gwenette GreetBrenda S Francenia Chimenti, RN 03/04/2015, 12:01 PM

## 2015-03-04 NOTE — Progress Notes (Signed)
Surgery Progress Note  S:  Enlarged pneumo on chest xray O:Blood pressure 101/59, pulse 114, temperature 99 F (37.2 C), temperature source Oral, resp. rate 30, height 5' (1.524 m), weight 56 lb (25.401 kg), SpO2 97 %. GEN: NAD/A&Ox3 RESP: No obvious air leak, poor respiratory effort  A/P 44 yo s/p tube thoracostomy for traumatic ptx, increase in ptx on water seal - -20 cm suction - cxr in am

## 2015-03-04 NOTE — Progress Notes (Signed)
Chest tube  To suction at present  D/t increase in   Pneumothorax on xray by 5-10% per dr Juliann Pulselundquist.  Prn x 1 for c/o pain  With relief today.

## 2015-03-04 NOTE — Progress Notes (Signed)
Notified of pt admission for PTX She [facility].  She has been evaluated by Dr Shelle Ironein in GI for wt loss and odynophagia If possible would be good to get EGD done while inpatient  Cont prophylaxtic treatments. Will hold on ARV until swallowing issues worked up as has been noncompliant due to it.

## 2015-03-04 NOTE — Plan of Care (Signed)
Problem: Safety: Goal: Ability to remain free from injury will improve Outcome: Progressing Patient has remained free of injury this shift.     Problem: Health Behavior/Discharge Planning: Goal: Ability to manage health-related needs will improve Outcome: Progressing Patient asks questions about medications and what they are for, shows interest/concern for improving and being discharged.     Problem: Pain Managment: Goal: General experience of comfort will improve Outcome: Progressing Patient has denied pain this shift. Pt allowing staff to turn per policy and uses call light to call for assistance when needed.     Problem: Skin Integrity: Goal: Risk for impaired skin integrity will decrease Outcome: Progressing Patient with pressure ulcer to coccyx, covered with pink Allevyn foam dressing, dry and intact. Patient being turned and repositioned per policy and pt request.

## 2015-03-04 NOTE — Evaluation (Signed)
Physical Therapy Evaluation Patient Details Name: Tamieka Rancourt MRN: [id number] DOB: 06-09-1970 Today's Date: 03/04/2015   History of Present Illness  Pt is a 44 y.o. female presenting to hospital s/p fall 03/01/15 onto R shoulder sustaining R pneumothorax.  S/p R chest tube placement 03/01/15.  R shoulder and L hip imaging negative for fx.  PMH includes HIV, anemia, stage 3 pressure ulcer L buttock.  Clinical Impression  Currently pt demonstrates impairments with overall strength, sensation, and self-reported limitations with functional mobility.  Pt was independent ambulating without AD until 2 months ago; pt reporting getting B LE neuropathy d/t antibiotics and then was unable to walk safely (pt reports 2 weeks later she was unable to sit by herself d/t balance issues).  Pt reports being bedbound last 2 months but her daughter carries her downstairs to the couch when she is home (pt gets carried around home as needed and has no home DME).  Pt lives with her 33 y.o. daughter who provides care for pt when she is not at school (otherwise pt is home alone without any assist).  Pt declined trying OOB mobility d/t fatigue but was agreeable to LE ex's in bed.  Pt would benefit from skilled PT to address above noted impairments and functional limitations.  Recommend pt discharge to home with 24/7 assist and HHPT when medically appropriate.  Need to further assess functional mobility to determine DME needs.     Follow Up Recommendations Supervision/Assistance - 24 hour;Home health PT    Equipment Recommendations   (TBD)    Recommendations for Other Services       Precautions / Restrictions Precautions Precautions: Fall Restrictions Weight Bearing Restrictions: No      Mobility  Bed Mobility               General bed mobility comments: Pt declined OOB d/t not feeling well (pt's Hg noted to be 7.2)  Transfers                    Ambulation/Gait                Stairs             Wheelchair Mobility    Modified Rankin (Stroke Patients Only)       Balance                                             Pertinent Vitals/Pain Pain Assessment: No/denies pain  Vitals stable and WFL throughout treatment session.    Home Living Family/patient expects to be discharged to:: Private residence Living Arrangements: Children (22 y.o. daughter) Available Help at Discharge: Family Type of Home:  (Townhome) Home Access: Level entry     Home Layout: Multi-level;1/2 bath on main level;Bed/bath upstairs Home Equipment: None      Prior Function Level of Independence: Needs assistance   Gait / Transfers Assistance Needed: pt was independent ambulating without AD until 2 months ago; pt reporting getting B LE neuropathy d/t antibiotics and then was unable to walk (pt reports 2 weeks later pt unable to sit by herself d/t balance issues).  Pt reports being bedbound last 2 months but her daughter carries her downstairs to the couch when she is home.  ADL's / Homemaking Assistance Needed: pt's daughter assists pt with toileting (pt toilets in the morning prior to daughter leaving  for school and when her daughter comes home from school); pt does sponge baths  Comments: pt's 46 y.o. daughter attends school and carries pt to get her around in the home     Hand Dominance        Extremity/Trunk Assessment   Upper Extremity Assessment: Generalized weakness           Lower Extremity Assessment: Generalized weakness (absent sensation and proprioception B feet/ankles)         Communication   Communication: No difficulties  Cognition Arousal/Alertness: Awake/alert Behavior During Therapy: WFL for tasks assessed/performed Overall Cognitive Status: Within Functional Limits for tasks assessed                      General Comments   Nursing cleared pt for participation in physical therapy.  Pt agreeable to limited PT session.     Exercises   Performed semi-supine B LE therapeutic exercise x 10 reps:  Ankle pumps (AROM B LE's); quad sets x3 second holds (AROM B LE's); glute squeezes x3 second holds (AROM B); SAQ's (AAROM R; AAROM L); heelslides (AAROM R; AAROM L), hip abd/adduction (AAROM R; AAROM L).  Pt required vc's and tactile cues for correct technique with exercises.       Assessment/Plan    PT Assessment Patient needs continued PT services  PT Diagnosis Difficulty walking;Generalized weakness   PT Problem List Decreased strength;Decreased activity tolerance;Decreased mobility;Impaired sensation  PT Treatment Interventions DME instruction;Gait training;Stair training;Functional mobility training;Therapeutic activities;Therapeutic exercise;Balance training;Neuromuscular re-education;Patient/family education;Wheelchair mobility training;Manual techniques   PT Goals (Current goals can be found in the Care Plan section) Acute Rehab PT Goals Patient Stated Goal: to go home PT Goal Formulation: With patient Time For Goal Achievement: 03/18/15 Potential to Achieve Goals: Fair    Frequency Min 2X/week   Barriers to discharge Decreased caregiver support      Co-evaluation               End of Session   Activity Tolerance: Patient limited by fatigue Patient left: in bed;with call bell/phone within reach;with bed alarm set           Time: 1550-1610 PT Time Calculation (min) (ACUTE ONLY): 20 min   Charges:   PT Evaluation $Initial PT Evaluation Tier I: 1 Procedure PT Treatments $Therapeutic Exercise: 8-22 mins   PT G CodesHendricks Limes:        Lowell Mcgurk 03/04/2015, 5:20 PM Hendricks LimesEmily Jdyn Parkerson, PT [phone]

## 2015-03-04 NOTE — Consult Note (Signed)
[facility] Infectious Disease     Reason for Consult HIV    Referring Physician: Bridgett Larsson Date of Admission:  03/01/2015   Principal Problem:   Pneumothorax on right Active Problems:   Protein-calorie malnutrition, severe   Pressure ulcer   HPI: Donna Hayden is a 44 y.o. female with advanced AIDS CD 4 ct 29 in Sept 2016, off meds, previously followed at [facility] who was admitted with traumatic ptx after a fall. She has had odynophagia and has had work up as otpt but no showed for multiple EGD attempts.  She has bene treated for thrush and is also on azithromycin weekly and started dapsone recently for PCP PX (Bactrim allergy, nml G6PD activity).  She has multiple other medical issues, is quite frail, losing wt, largely immobile.    Past Medical History  Diagnosis Date  . HIV (human immunodeficiency virus infection) ([place])   . Anemia     Iron Deficient  . Osteomyelitis ([facility]) Bilateral Femoral  . Parotid cyst   . Pneumonia   . Pulmonary hypertension ([place])   . 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 Septic arthritis ([place])   . Sickle cell trait ([place])   . Shortness of breath dyspnea    Past Surgical History  Procedure Laterality Date  . No past surgeries    . Shoulder arthroscopy     Social History  Substance Use Topics  . Smoking status: Never Smoker   . Smokeless tobacco: Never Used  . Alcohol Use: No   Family History  Problem Relation Age of Onset  . Diabetes Mother   . Coarctation of the aorta Other     Allergies:  Allergies  Allergen Reactions  . Penicillins Hives  . Sulfa Antibiotics Nausea Only and Rash    Rash and nausea. No breathing issues    Current antibiotics: Antibiotics Given (last 72 hours)    Date/Time Action Medication Dose   03/02/15 0045 Given   nitrofurantoin (MACRODANTIN) capsule 100 mg 100 mg   03/02/15 0209 Given   azithromycin (ZITHROMAX) 200 MG/5ML suspension 600 mg 600 mg   03/04/15 1802 Given   dapsone tablet 100 mg 100 mg       MEDICATIONS: . azithromycin  600 mg Oral Weekly  . dapsone  100 mg Oral Daily  . fluconazole  200 mg Oral Daily  . heparin  5,000 Units Subcutaneous 3 times per day    Review of Systems - 11 systems reviewed and negative per HPI   OBJECTIVE: Temp:  [97.6 F (36.4 C)-100 F (37.8 C)] 97.6 F (36.4 C) (11/07 2047) Pulse Rate:  [94-123] 97 (11/07 2047) Resp:  [30-34] 30 (11/07 0546) BP: (92-108)/(55-62) 103/62 mmHg (11/07 2047) SpO2:  [95 %-98 %] 97 % (11/07 2047) Physical Exam  Constitutional:  Cachectic HENT: [place]/AT, PERRLA, no scleral icterus Mouth/Throat: Oropharynx is clear and moist. No oropharyngeal exudate.  Cardiovascular: Normal rate, regular rhythm and normal heart sounds. Pulmonary/Chest: dec bs R side, CT in place Neck = supple, no nuchal rigidity Abdominal: Soft. Bowel sounds are normal.  exhibits no distension. There is no tenderness.  Lymphadenopathy: no cervical adenopathy. No axillary adenopathy Neurological: alert and oriented to person, place, and time.  Skin: Skin is warm and dry. No rash noted. No erythema.  Psychiatric: flat affect   LABS: Results for orders placed or performed during the hospital encounter of 03/01/15 (from the past 48 hour(s))  Basic metabolic panel     Status: Abnormal   Collection Time:  03/03/15  3:35 AM  Result Value Ref Range   Sodium 133 (L) 135 - 145 mmol/L   Potassium 3.2 (L) 3.5 - 5.1 mmol/L   Chloride 109 101 - 111 mmol/L   CO2 22 22 - 32 mmol/L   Glucose, Bld 84 65 - 99 mg/dL   BUN 11 6 - 20 mg/dL   Creatinine, Ser 0.54 0.44 - 1.00 mg/dL   Calcium 7.3 (L) 8.9 - 10.3 mg/dL   GFR calc non Af Amer >60 >60 mL/min   GFR calc Af Amer >60 >60 mL/min    Comment: (NOTE) The eGFR has been calculated using the CKD EPI equation. This calculation has not been validated in all clinical situations. eGFR's persistently <60 mL/min signify possible Chronic Kidney Disease.    Anion gap 2 (L) 5 - 15  CBC     Status: Abnormal    Collection Time: 03/03/15  3:35 AM  Result Value Ref Range   WBC 2.4 (L) 3.6 - 11.0 K/uL   RBC 2.67 (L) 3.80 - 5.20 MIL/uL   Hemoglobin 7.2 (L) 12.0 - 16.0 g/dL   HCT 23.0 (L) 35.0 - 47.0 %   MCV 85.9 80.0 - 100.0 fL   MCH 26.8 26.0 - 34.0 pg   MCHC 31.2 (L) 32.0 - 36.0 g/dL   RDW 18.4 (H) 11.5 - 14.5 %   Platelets 188 150 - 440 K/uL  Magnesium     Status: Abnormal   Collection Time: 03/03/15  3:35 AM  Result Value Ref Range   Magnesium 1.4 (L) 1.7 - 2.4 mg/dL  Magnesium     Status: Abnormal   Collection Time: 03/04/15  4:30 AM  Result Value Ref Range   Magnesium 1.6 (L) 1.7 - 2.4 mg/dL  Basic metabolic panel     Status: Abnormal   Collection Time: 03/04/15  4:30 AM  Result Value Ref Range   Sodium 130 (L) 135 - 145 mmol/L   Potassium 2.8 (LL) 3.5 - 5.1 mmol/L    Comment: CRITICAL RESULT CALLED TO, READ BACK BY AND VERIFIED WITH MELISSA COBB AT 0510 03/04/15.PMH   Chloride 106 101 - 111 mmol/L   CO2 19 (L) 22 - 32 mmol/L   Glucose, Bld 82 65 - 99 mg/dL   BUN 7 6 - 20 mg/dL   Creatinine, Ser 0.45 0.44 - 1.00 mg/dL   Calcium 7.3 (L) 8.9 - 10.3 mg/dL   GFR calc non Af Amer >60 >60 mL/min   GFR calc Af Amer >60 >60 mL/min    Comment: (NOTE) The eGFR has been calculated using the CKD EPI equation. This calculation has not been validated in all clinical situations. eGFR's persistently <60 mL/min signify possible Chronic Kidney Disease.    Anion gap 5 5 - 15  CBC     Status: Abnormal   Collection Time: 03/04/15  4:30 AM  Result Value Ref Range   WBC 4.2 3.6 - 11.0 K/uL   RBC 2.69 (L) 3.80 - 5.20 MIL/uL   Hemoglobin 7.2 (L) 12.0 - 16.0 g/dL   HCT 22.7 (L) 35.0 - 47.0 %   MCV 84.4 80.0 - 100.0 fL   MCH 26.7 26.0 - 34.0 pg   MCHC 31.7 (L) 32.0 - 36.0 g/dL   RDW 18.2 (H) 11.5 - 14.5 %   Platelets 208 150 - 440 K/uL   No components found for: ESR, C REACTIVE PROTEIN MICRO: No results found for this or any previous visit (from the past 720 hour(s)).  IMAGING:  Dg Chest 2  View  03/04/2015  CLINICAL DATA:  Follow-up of pneumo thorax, persistent slight cough and congestion. EXAM: CHEST  2 VIEW COMPARISON:  Portable chest x-ray of March 03, 2015 FINDINGS: The lungs remain hyperinflated. The small caliber chest tube tip remains coiled in the pulmonary apex. A small apical pneumothorax is visible with the pleural line lying between the second and third posterior ribs. There is no mediastinal shift. The left lung exhibits coarse interstitial markings in the lower lobe posteriorly. Minimal blunting of the posterior costophrenic angles bilaterally likely reflects a trace pleural fluid. The cardiac silhouette remains mildly enlarged. The pulmonary vascularity is normal. The bony thorax exhibits no acute abnormality. IMPRESSION: 1. Slight interval increase in the volume of the right apical pneumothorax which now amounts to 5-10% of the lung volume. Stable appearance of the small caliber right-sided chest tube whose tip lies in the apex just above the level of the pleural line. 2. Persistent interstitial infiltrate in the left lower lobe posteriorly. Trace pleural effusions blunt the posterior costophrenic angles. 3. Persistent cardiomegaly without pulmonary vascular congestion. 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 4. These results were called by telephone at the time of interpretation on 03/04/2015 at 7:51 am to Rowe Robert, RN,, who verbally acknowledged these results. Electronically Signed   By: Izayah Miner  [place] M.D.   On: 03/04/2015 07:53   Dg Shoulder Right  03/01/2015  CLINICAL DATA:  Right shoulder pain after fall. EXAM: RIGHT SHOULDER - 2+ VIEW COMPARISON:  None. FINDINGS: There is no evidence of fracture or dislocation. There is no evidence of arthropathy or other focal bone abnormality. Soft tissues are unremarkable. There is a right sided pneumothorax identified. This measures approximately 15%. No displaced rib fractures identified. IMPRESSION: 1. No evidence for  shoulder dislocation or fracture. 2. Right-sided pneumothorax. Recommend further investigation with PA and lateral chest radiograph. Critical Value/emergent results were called by telephone at the time of interpretation on 03/01/2015 at 8:27 pm to Dr. Delman Kitten , who verbally acknowledged these results. Electronically Signed   By: Kerby Moors M.D.   On: 03/01/2015 20:27   Dg Chest Port 1 View  03/03/2015  CLINICAL DATA:  44 year old female with history of pneumothorax. EXAM: PORTABLE CHEST 1 VIEW COMPARISON:  Chest x-ray 03/02/2015. FINDINGS: Right-sided small bore chest tube with pigtail in the apex of the right upper lobe. Tiny lucency adjacent to the tip of the catheter likely reflects a tiny residual apical pneumothorax (less than 2% of the volume of the right hemithorax). Diffuse peribronchial cuffing, similar prior examinations. More pronounced peribronchovascular opacities in the left lower lobe may suggest a developing bronchopneumonia. No pleural effusions. No evidence of pulmonary edema. Heart size is normal. The patient is rotated to the left on today's exam, resulting in distortion of the mediastinal contours and reduced diagnostic sensitivity and specificity for mediastinal pathology. IMPRESSION: 1. Right-sided chest tube is stable in position. There is only a trace residual right apical pneumothorax noted on today's examination. 2. Diffuse bronchial wall thickening similar to recent prior examinations, suggestive of bronchitis. More severe peribronchovascular opacities in the left lower lobe may reflect a developing bronchopneumonia. Electronically Signed   By: Vinnie Langton M.D.   On: 03/03/2015 08:09   Dg Chest Port 1 View  03/02/2015  CLINICAL DATA:  Followup pneumothorax EXAM: PORTABLE CHEST 1 VIEW COMPARISON:  03/01/2015 FINDINGS: Stable position of right chest tube. No significant pneumothorax visible. Heart size is normal. No pleural effusion or edema. No airspace consolidation.  IMPRESSION: 1. Right-sided chest tube in place with no visible pneumothorax identified at this time. Electronically Signed   By: Kerby Moors M.D.   On: 03/02/2015 12:14   Dg Chest Port 1 View  03/01/2015  CLINICAL DATA:  Status post right chest tube placement for pneumothorax. EXAM: PORTABLE CHEST 1 VIEW COMPARISON:  03/01/2015 at 2040 hours FINDINGS: Pigtail chest tube has a placed on the right. The tip curls at the right apex. The pneumothorax has been partly evacuated. A small pneumothorax persists along the right lateral mid to upper lung. Lungs are clear.  No pleural effusion.  No left pneumothorax. IMPRESSION: 1. Status post right-sided chest tube placement with significant re-expansion of the right lung. There is a small residual pneumothorax along the right lateral mid to upper hemi thorax. Electronically Signed   By: Lajean Manes M.D.   On: 03/01/2015 23:38   Dg Chest Port 1 View  03/01/2015  CLINICAL DATA:  Right chest pain after fall today EXAM: PORTABLE CHEST 1 VIEW COMPARISON:  01/22/2015 FINDINGS: There is a large right pneumothorax, approximately 30% by volume. Left lung is well expanded and clear. Hilar and mediastinal contours are unremarkable. No displaced rib fractures are evident. IMPRESSION: Large right pneumothorax. These results were called by telephone at the time of interpretation on 03/01/2015 at 9:14 pm to Dr. Madaline Savage, who verbally acknowledged these results. Electronically Signed   By: Andreas Newport M.D.   On: 03/01/2015 21:14   Dg Hip Port Unilat With Pelvis 1v Left  03/01/2015  CLINICAL DATA:  Left hip pain following a fall today. EXAM: DG HIP (WITH OR WITHOUT PELVIS) 1V PORT LEFT COMPARISON:  None. FINDINGS: There is no evidence of hip fracture or dislocation. There is no evidence of arthropathy or other focal bone abnormality. IMPRESSION: Negative. Electronically Signed   By: Lajean Manes M.D.   On: 03/01/2015 21:21    Assessment:   Donna Hayden is a 44 y.o.  female with advanced AIDS CD 4 ct 45 in Sept 2016, off meds, previously followed at [facility] who was admitted with traumatic ptx after a fall. She has had odynophagia and has had work up as otpt but no showed for multiple EGD attempts.  She has bene treated for thrush and is also on azithromycin weekly and started dapsone recently for PCP PX (Bactrim allergy, nml G6PD activity).  She has multiple other medical issues, is quite frail, losing wt, largely immobile.    Recommendations Continue azithro and fluconazole Add dapsone For EGD if stable enough  Once can take po will start ART WIll need a great deal of care at home- lives only with her 58 year old daughter).  Would be best if could be in a facility for 1-2 months to monitor while starting ART.  Thank you very much for allowing me to participate in the care of this patient. Please call with questions.   Cheral Marker. Ola Spurr, MD

## 2015-03-04 NOTE — Progress Notes (Signed)
GI Note:  Pt well known to me. She has no showed for multiple EGD as an outpatient.    We will plan for possible EGD tomorrow late afternoon if we can get anesthesia support and anesthesia ok doing case given recent PTX - npo after mn.

## 2015-03-04 NOTE — Care Management (Addendum)
Admitted to [facility] with the diagnosis of pneumothrax.. Lives with daughter, Chrystie NoseRockwell.This daughter is 46 years old and attends school. Gets to school per school bus. This daughter takes care of her during the day, when she is home, and during the night. Ms. Nunzio CoryLyons was diagnosed with AIDS at the time the daughter was born 15 years ago. States she has been bedbound for the last 2 months. Lose 100 lbs from December 2014 until now.  Sisters are Eloise Levelsrnestine Allan [phone]) and  Burgess EstelleShelia Bobbitt [phone]). Last seen Dr. Sampson GoonFitzgerald October 6th. No home oxygen. No home health. States she has no medical equipment in the home. States her bedroom is on the second story [address]. Will not want to go to a facility because her daughter would have no where to live. Wants to return home with services, if possible. No falls. Fair appetite.  Telephone call to [facility]. Lorenza CambridgeJulie Willets [facility] has been working on PCA services per Dr. Sampson GoonFitzgerald. Will contact Ms. Willets to let her know Ms. Nunzio CoryLyons is here at the hospital. Insurances are Medicare and Medicaid.

## 2015-03-04 NOTE — Progress Notes (Signed)
[facility] - [place] at [facility]   PATIENT NAME: Donna Hayden    MR#:  [id number]  DATE OF BIRTH:  [birth year]  SUBJECTIVE:  CHIEF COMPLAINT:   Chief Complaint  Patient presents with  . Shoulder Injury   no complaint.  REVIEW OF SYSTEMS:  CONSTITUTIONAL: No fever, fatigue or weakness.  EYES: No blurred or double vision.  EARS, NOSE, AND THROAT: No tinnitus or ear pain.  RESPIRATORY: No cough, shortness of breath, wheezing or hemoptysis.  CARDIOVASCULAR: No chest pain, orthopnea, edema.  GASTROINTESTINAL: No nausea, vomiting, diarrhea or abdominal pain.  GENITOURINARY: No dysuria, hematuria.  ENDOCRINE: No polyuria, nocturia,  HEMATOLOGY: No anemia, easy bruising or bleeding SKIN: No rash or lesion. MUSCULOSKELETAL: Tenderness on the right shoulder.   NEUROLOGIC: No tingling, numbness, weakness.  PSYCHIATRY: No anxiety or depression.   DRUG ALLERGIES:   Allergies  Allergen Reactions  . Penicillins Hives  . Sulfa Antibiotics Nausea Only and Rash    Rash and nausea. No breathing issues    VITALS:  Blood pressure 92/55, pulse 94, temperature 98 F (36.7 C), temperature source Oral, resp. rate 30, height 5' (1.524 m), weight 25.401 kg (56 lb), SpO2 98 %.  PHYSICAL EXAMINATION:  GENERAL:  44 y.o.-year-old patient lying in the bed with no acute distress. Chronically ill-appearing and malnutrition status. EYES: Pupils equal, round, reactive to light and accommodation. No scleral icterus. Extraocular muscles intact.  HEENT: Head atraumatic, normocephalic. Oropharynx and nasopharynx clear.  NECK:  Supple, no jugular venous distention. No thyroid enlargement, no tenderness.  LUNGS: Normal breath sounds bilaterally, no wheezing, rales,rhonchi or crepitation. No use of accessory muscles of respiration. No air leak from chest tube suction. CARDIOVASCULAR: S1, S2 normal. No murmurs, rubs, or gallops.  ABDOMEN: Soft, nontender, nondistended. Bowel  sounds present. No organomegaly or mass.  EXTREMITIES: No pedal edema, cyanosis, or clubbing.  NEUROLOGIC: Cranial nerves II through XII are intact. Muscle strength 5/5 in all extremities. Sensation intact. Gait not checked.  PSYCHIATRIC: The patient is alert and oriented x 3.  SKIN: No obvious rash, lesion, or ulcer.    LABORATORY PANEL:   CBC  Recent Labs Lab 03/04/15 0430  WBC 4.2  HGB 7.2*  HCT 22.7*  PLT 208   ------------------------------------------------------------------------------------------------------------------  Chemistries   Recent Labs Lab 03/01/15 2110  03/04/15 0430  NA 132*  < > 130*  K 3.3*  < > 2.8*  CL 103  < > 106  CO2 26  < > 19*  GLUCOSE 91  < > 82  BUN 13  < > 7  CREATININE 0.61  < > 0.45  CALCIUM 8.3*  < > 7.3*  MG  --   < > 1.6*  AST 29  --   --   ALT 7*  --   --   ALKPHOS 36*  --   --   BILITOT 0.7  --   --   < > = values in this interval not displayed. ------------------------------------------------------------------------------------------------------------------  Cardiac Enzymes No results for input(s): TROPONINI in the last 168 hours. ------------------------------------------------------------------------------------------------------------------  RADIOLOGY:  Dg Chest 2 View  03/04/2015  CLINICAL DATA:  Follow-up of pneumo thorax, persistent slight cough and congestion. EXAM: CHEST  2 VIEW COMPARISON:  Portable chest x-ray of March 03, 2015 FINDINGS: The lungs remain hyperinflated. The small caliber chest tube tip remains coiled in the pulmonary apex. A small apical pneumothorax is visible with the pleural line lying between the second and third posterior ribs. There is  no mediastinal shift. The left lung exhibits coarse interstitial markings in the lower lobe posteriorly. Minimal blunting of the posterior costophrenic angles bilaterally likely reflects a trace pleural fluid. The cardiac silhouette remains mildly enlarged. The  pulmonary vascularity is normal. The bony thorax exhibits no acute abnormality. IMPRESSION: 1. Slight interval increase in the volume of the right apical pneumothorax which now amounts to 5-10% of the lung volume. Stable appearance of the small caliber right-sided chest tube whose tip lies in the apex just above the level of the pleural line. 2. Persistent interstitial infiltrate in the left lower lobe posteriorly. Trace pleural effusions blunt the posterior costophrenic angles. 3. Persistent cardiomegaly without pulmonary vascular congestion. 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 4. These results were called by telephone at the time of interpretation on 03/04/2015 at 7:51 am to Luretha MurphyWanette Miles, RN,, who verbally acknowledged these results. Electronically Signed   By: David  SwazilandJordan M.D.   On: 03/04/2015 07:53   Dg Chest Port 1 View  03/03/2015  CLINICAL DATA:  44 year old female with history of pneumothorax. EXAM: PORTABLE CHEST 1 VIEW COMPARISON:  Chest x-ray 03/02/2015. FINDINGS: Right-sided small bore chest tube with pigtail in the apex of the right upper lobe. Tiny lucency adjacent to the tip of the catheter likely reflects a tiny residual apical pneumothorax (less than 2% of the volume of the right hemithorax). Diffuse peribronchial cuffing, similar prior examinations. More pronounced peribronchovascular opacities in the left lower lobe may suggest a developing bronchopneumonia. No pleural effusions. No evidence of pulmonary edema. Heart size is normal. The patient is rotated to the left on today's exam, resulting in distortion of the mediastinal contours and reduced diagnostic sensitivity and specificity for mediastinal pathology. IMPRESSION: 1. Right-sided chest tube is stable in position. There is only a trace residual right apical pneumothorax noted on today's examination. 2. Diffuse bronchial wall thickening similar to recent prior examinations, suggestive of bronchitis. More severe  peribronchovascular opacities in the left lower lobe may reflect a developing bronchopneumonia. Electronically Signed   By: Trudie Reedaniel  Entrikin M.D.   On: 03/03/2015 08:09    EKG:  No orders found for this or any previous visit.  ASSESSMENT AND PLAN:   1, Right-sided pneumothorax.  continue chest tube, follow-up with surgeon. 2. Hyponatremia. Continue normal saline IV, follow-up BMP. 3. Hypokalemia.  KCl,  follow-up BMP.  * Hypomagnesemia. Give IV magnesium and follow-up level.  4. HIV/AIDS, per pharmacist, the patient is not on HIV medication, discontinued current HIV medications  Continue home medications Diflucan, Zithromax and dapsone. Check CD4.  *Severe malnutrition. Follow-up Dietitian consult. Need GI consult for odynophagia and weight loss per Dr. Sampson GoonFitzgerald.  *Anemia of chronic disease. Stable. Hemoglobin decreased from 8.1 to 7.2, follow-up CBC. * Neutropenia. Improved.   All the records are reviewed and case discussed with Care Management/Social Workerr. Management plans discussed with the patient, her daughter and they are in agreement. Greater than 50% time was spent on coordination of care and face-to-face counseling.  CODE STATUS: Full code  TOTAL TIME TAKING CARE OF THIS PATIENT: 36 minutes.   POSSIBLE D/C IN 2 DAYS, DEPENDING ON CLINICAL CONDITION.   Shaune Pollackhen, Niyam Bisping M.D on 03/04/2015 at 3:45 PM  Between 7am to 6pm - Pager - [phone]  After 6pm go to [email or web address] - password [email or web address] [facility]  [facility]  Office  [phone]  CC: Primary care physician; WOODS, Garnette CzechHRISTOPHER WILDRICK, MD

## 2015-03-05 ENCOUNTER — Encounter: Payer: Self-pay | Admitting: Anesthesiology

## 2015-03-05 ENCOUNTER — Inpatient Hospital Stay: Payer: Medicare Other

## 2015-03-05 ENCOUNTER — Encounter
Admission: EM | Disposition: A | Discharge status: Home / Self Care | Payer: Self-pay | Source: Home / Self Care | Attending: Internal Medicine

## 2015-03-05 LAB — CBC
HEMATOCRIT: 24.9 % — AB (ref 35.0–47.0)
HEMOGLOBIN: 7.9 g/dL — AB (ref 12.0–16.0)
MCH: 26.9 pg (ref 26.0–34.0)
MCHC: 31.7 g/dL — AB (ref 32.0–36.0)
MCV: 84.8 fL (ref 80.0–100.0)
Platelets: 224 10*3/uL (ref 150–440)
RBC: 2.93 MIL/uL — AB (ref 3.80–5.20)
RDW: 18.5 % — ABNORMAL HIGH (ref 11.5–14.5)
WBC: 2.8 10*3/uL — ABNORMAL LOW (ref 3.6–11.0)

## 2015-03-05 LAB — BASIC METABOLIC PANEL
ANION GAP: 5 (ref 5–15)
BUN: 9 mg/dL (ref 6–20)
CHLORIDE: 103 mmol/L (ref 101–111)
CO2: 18 mmol/L — AB (ref 22–32)
Calcium: 7.3 mg/dL — ABNORMAL LOW (ref 8.9–10.3)
Creatinine, Ser: 0.49 mg/dL (ref 0.44–1.00)
GFR calc non Af Amer: >60 mL/min (ref >60)
GLUCOSE: 71 mg/dL (ref 65–99)
POTASSIUM: 3.9 mmol/L (ref 3.5–5.1)
Sodium: 126 mmol/L — ABNORMAL LOW (ref 135–145)

## 2015-03-05 LAB — MAGNESIUM: Magnesium: 1.6 mg/dL — ABNORMAL LOW (ref 1.7–2.4)

## 2015-03-05 SURGERY — ESOPHAGOGASTRODUODENOSCOPY (EGD) WITH PROPOFOL
Anesthesia: General

## 2015-03-05 MED ORDER — MAGNESIUM SULFATE 2 GM/50ML IV SOLN
2.0000 g | Freq: Once | INTRAVENOUS | Status: AC
  Administered 2015-03-05: 2 g via INTRAVENOUS
  Filled 2015-03-05: qty 50

## 2015-03-05 MED ORDER — SODIUM CHLORIDE 1 G PO TABS
1.0000 g | ORAL_TABLET | Freq: Three times a day (TID) | ORAL | Status: DC
  Administered 2015-03-05 – 2015-03-11 (×5): 1 g via ORAL
  Filled 2015-03-05 (×23): qty 1

## 2015-03-05 MED ORDER — COLLAGENASE 250 UNIT/GM EX OINT
TOPICAL_OINTMENT | Freq: Every day | CUTANEOUS | Status: DC
  Administered 2015-03-05 – 2015-03-11 (×7): via TOPICAL
  Filled 2015-03-05 (×2): qty 30

## 2015-03-05 NOTE — Plan of Care (Signed)
Pt's upper endoscopy cancelled today b/c of low Na level.  Test to be done b/c pt is experiencing painful swallowing. But she did eat better today and feels like she's improving. Will try again tomorrow if successfully replaced. Had wound consult for unstageable sacral wound.  Wound care instructions in place.  Airbed needs to be ordered tomorrow.  Pt is at high risk for skin breakdown.  No drainage in chest tube today.

## 2015-03-05 NOTE — Consult Note (Signed)
GI Inpatient Consult Note  Reason for Consult: odynophagia   Attending Requesting Consult: Sampson GoonFitzgerald  History of Present Illness: Donna Hayden is a 44 y.o. female with severe AIDS a/w fall and PTX, GI consulted for odynophagia. I have seen Donna Hayden several times as outpatient for odynophagia and wt loss. She initially responded well to 21 day treatment with fluconazole but the odnophagia retruned and has persisted despite ongoing treatmetn with fluconazole.  Unfortunately, she has no showed for multiple EGD and we had a difficult time getting her to show up for appt with ID specialist. [facility] by Dr Joseph ArtWoods but had transportation issues getting there.     She is currently very weak but reports that her trouble swallowing is better on the fluconazole.  Also denies weight loss but not gainign weight  No rectal bleeding, melena, abd pain.   She was admitted with Ptx after fall at home.  Chest tube in place, Ptx resolved on CXR  Past Medical History:  Past Medical History  Diagnosis Date  . HIV (human immunodeficiency virus infection) (HCC)   . Anemia     Iron Deficient  . Osteomyelitis (HCC) Bilateral Femoral  . Parotid cyst   . Pneumonia   . Pulmonary hypertension (HCC)   . Selective deficiency of IgG subclasses (HCC)   . Septic arthritis (HCC)   . Sickle cell trait (HCC)   . Shortness of breath dyspnea     Problem List: Patient Active Problem List   Diagnosis Date Noted  . Protein-calorie malnutrition, severe 03/03/2015  . Pressure ulcer 03/03/2015  . Pneumothorax on right 03/01/2015  . Pneumothorax     Past Surgical History: Past Surgical History  Procedure Laterality Date  . No past surgeries    . Shoulder arthroscopy      Allergies: Allergies  Allergen Reactions  . Penicillins Hives  . Sulfa Antibiotics Nausea Only and Rash    Rash and nausea. No breathing issues    Home Medications: Prescriptions prior to admission  Medication Sig Dispense Refill  Last Dose  . azithromycin (ZITHROMAX) 200 MG/5ML suspension Take 15 mLs by mouth once a week.  1 Past Week at Unknown time  . fluconazole (DIFLUCAN) 40 MG/ML suspension Take 200 mg by mouth daily. For 30 days, prescribed 10/20  0 03/01/2015 at Unknown time  . [DISCONTINUED] nitrofurantoin (MACRODANTIN) 100 MG capsule Take 1 capsule (100 mg total) by mouth 2 (two) times daily. (Patient not taking: Reported on 03/03/2015) 20 capsule 0 Completed Course at Unknown time   Home medication reconciliation was completed with the patient.   Scheduled Inpatient Medications:   . azithromycin  600 mg Oral Weekly  . dapsone  100 mg Oral Daily  . fluconazole  200 mg Oral Daily  . heparin  5,000 Units Subcutaneous 3 times per day  . magnesium sulfate 1 - 4 g bolus IVPB  2 g Intravenous Once    Continuous Inpatient Infusions:   . sodium chloride 75 mL/hr at 03/05/15 0500    PRN Inpatient Medications:  acetaminophen **OR** acetaminophen, ipratropium-albuterol, morphine injection, ondansetron **OR** ondansetron (ZOFRAN) IV, oxyCODONE  Family History: family history includes Coarctation of the aorta in her other; Diabetes in her mother.  The patient's family history is negative for inflammatory bowel disorders, GI malignancy, or solid organ transplantation.  Social History:   reports that she has never smoked. She has never used smokeless tobacco. She reports that she does not drink alcohol or use illicit drugs.  Review of Systems: Constitutional: Weight is down, but stable recently Eyes: No changes in vision. ENT: No oral lesions, +sore throat.  GI: see HPI.  Heme/Lymph: No easy bruising.  CV: + chest pain from tube GU: No hematuria.  Integumentary: No rashes.  Neuro: No headaches.  Endocrine: No heat/cold intolerance.  Allergic/Immunologic: No urticaria.  Resp: No cough, SOB.  Musculoskeletal: No joint swelling.    Physical Examination: BP 98/59 mmHg  Pulse 127  Temp(Src) 98.8 F (37.1  C) (Oral)  Resp 30  Ht 5' (1.524 m)  Wt 25.401 kg (56 lb)  BMI 10.94 kg/m2  SpO2 100%  LMP  Gen: NAD, alert and oriented x 3, appears chronically ill, severely underweight HEENT: PEERLA, EOMI, Neck: supple, no JVD or thyromegaly Chest: CTA bilaterally, no wheezes, crackles, or other adventitious sounds, + chest tube CV: RRR, no m/g/c/r Abd: soft, NT, ND, +BS in all four quadrants; no HSM, guarding, ridigity, or rebound tenderness Ext: no edema, well perfused with 2+ pulses, Skin: no rash or lesions noted Lymph: no LAD  Data: Lab Results  Component Value Date   WBC 2.8* 03/05/2015   HGB 7.9* 03/05/2015   HCT 24.9* 03/05/2015   MCV 84.8 03/05/2015   PLT 224 03/05/2015    Recent Labs Lab 03/03/15 0335 03/04/15 0430 03/05/15 0547  HGB 7.2* 7.2* 7.9*   Lab Results  Component Value Date   NA 126* 03/05/2015   K 3.9 03/05/2015   CL 103 03/05/2015   CO2 18* 03/05/2015   BUN 9 03/05/2015   CREATININE 0.49 03/05/2015   Lab Results  Component Value Date   ALT 7* 03/01/2015   AST 29 03/01/2015   ALKPHOS 36* 03/01/2015   BILITOT 0.7 03/01/2015    Recent Labs Lab 03/01/15 2110  INR 1.18   Assessment/Plan: Donna Hayden is a 44 y.o. female with severe AIDS, odynophagia which has improved some on  fluconazole.  Ddx esophagial candidiasis vs viral esophagitis ( CMV, etc) vs malignancy.   Recommendations: - possible EGD 11/9 if stable enough from anesthesia standpoint.  - cont fluconazole, PPI - further recs pending EGD.   Thank you for the consult. Please call with questions or concerns.  Donna Hayden, Addison Naegeli, MD

## 2015-03-05 NOTE — Progress Notes (Signed)
[facility] CLINIC INFECTIOUS DISEASE PROGRESS NOTE Date of Admission:  03/01/2015     ID: Donna Hayden is a 44 y.o. female with HIV/AIDS, traumatic PTX  Principal Problem:   Pneumothorax on right Active Problems:   Protein-calorie malnutrition, severe   Pressure ulcer   Subjective: Breathing stable. Temp to 101.  No new complains  ROS  Eleven systems are reviewed and negative except per hpi  Medications:  Antibiotics Given (last 72 hours)    Date/Time Action Medication Dose   03/04/15 1802 Given   dapsone tablet 100 mg 100 mg     . azithromycin  600 mg Oral Weekly  . collagenase   Topical Daily  . dapsone  100 mg Oral Daily  . fluconazole  200 mg Oral Daily  . heparin  5,000 Units Subcutaneous 3 times per day   Objective: Vital signs in last 24 hours: Temp:  [97.6 F (36.4 C)-101.4 F (38.6 C)] 99.1 F (37.3 C) (11/08 1000) Pulse Rate:  [94-131] 131 (11/08 0815) Resp:  [32] 32 (11/08 0815) BP: (92-103)/(55-62) 102/61 mmHg (11/08 0815) SpO2:  [97 %-100 %] 100 % (11/08 0815) Constitutional: Cachectic HENT: [place]/AT, PERRLA, no scleral icterus Mouth/Throat: Oropharynx is clear and moist. No oropharyngeal exudate.  Cardiovascular: Normal rate, regular rhythm and normal heart sounds. Pulmonary/Chest: dec bs R side, CT in place Neck = supple, no nuchal rigidity Abdominal: Soft. Bowel sounds are normal. exhibits no distension. There is no tenderness.  Lymphadenopathy: no cervical adenopathy. No axillary adenopathy Neurological: alert and oriented to person, place, and time.  Skin: Skin is warm and dry. No rash noted. No erythema.  Psychiatric: flat affect   Lab Results  Recent Labs  03/04/15 0430 03/05/15 0547  WBC 4.2 2.8*  HGB 7.2* 7.9*  HCT 22.7* 24.9*  NA 130* 126*  K 2.8* 3.9  CL 106 103  CO2 19* 18*  BUN 7 9  CREATININE 0.45 0.49   Studies/Results: Dg Chest 2 View  03/04/2015  CLINICAL DATA:  Follow-up of pneumo thorax, persistent slight cough  and congestion. EXAM: CHEST  2 VIEW COMPARISON:  Portable chest x-ray of March 03, 2015 FINDINGS: The lungs remain hyperinflated. The small caliber chest tube tip remains coiled in the pulmonary apex. A small apical pneumothorax is visible with the pleural line lying between the second and third posterior ribs. There is no mediastinal shift. The left lung exhibits coarse interstitial markings in the lower lobe posteriorly. Minimal blunting of the posterior costophrenic angles bilaterally likely reflects a trace pleural fluid. The cardiac silhouette remains mildly enlarged. The pulmonary vascularity is normal. The bony thorax exhibits no acute abnormality. IMPRESSION: 1. Slight interval increase in the volume of the right apical pneumothorax which now amounts to 5-10% of the lung volume. Stable appearance of the small caliber right-sided chest tube whose tip lies in the apex just above the level of the pleural line. 2. Persistent interstitial infiltrate in the left lower lobe posteriorly. Trace pleural effusions blunt the posterior costophrenic angles. 3. Persistent cardiomegaly without pulmonary vascular congestion. 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 4. These results were called by telephone at the time of interpretation on 03/04/2015 at 7:51 am to Luretha Murphy, RN,, who verbally acknowledged these results. Electronically Signed   By: Cassadee Vanzandt  [place] M.D.   On: 03/04/2015 07:53   Dg Chest Port 1 View  03/05/2015  CLINICAL DATA:  Pneumothorax. EXAM: PORTABLE CHEST 1 VIEW COMPARISON:  03/04/2015. FINDINGS: Right chest tube in stable position. Interim near complete resolution  of small right apical pneumothorax. Left lower lobe infiltrate. Small left pleural effusion. Heart size normal. No acute bony abnormality. IMPRESSION: 1. Right chest tube in stable position. Interval near complete resolution of tiny right apical pneumothorax. 2. Left lower lobe mild infiltrate. Electronically Signed   By: Maisie Fushomas   Register   On: 03/05/2015 07:19    Assessment/Plan:  Donna Hayden is a 44 y.o. female with advanced AIDS CD 4 ct 29 in Sept 2016, off meds, [facility] who was admitted with traumatic ptx after a fall. She has had odynophagia and has had work up as otpt but no showed for multiple EGD attempts.  She has bene treated for thrush and is also on azithromycin weekly and started dapsone recently for PCP PX (Bactrim allergy, nml G6PD activity). She has multiple other medical issues, is quite frail, losing wt, largely immobile.  I reviewed her notes from [facility] and her prior labs.  She has not had an undetectable viral load in many years and her CD4 ct has continued to decline. I think she has a long history of non compliance as the most likely cause. Recommendations Continue azithro, fluconazole and dapsone For EGD if stable enough  Once can take po will start ART -  WIll need a great deal of care at home- lives only with her 348 year old daughter). Would be best if could be in a facility for 1-2 months to monitor while starting ART.  Thank you very much for the consult. Will follow with you.  [name]   03/05/2015, 11:46 AM

## 2015-03-05 NOTE — Plan of Care (Signed)
Problem: Nutrition: Goal: Adequate nutrition will be maintained Outcome: Progressing Plan of care, progress to goals. 1. No falls and pt is free from injury. 2. No complaints of pain. 3. Pt NPO for EGD today. 4. Chest tube to suction with no output this admission. 5. Patent foley with clear, yellow output. Henriette CombsSarah Normalee Sistare RN

## 2015-03-05 NOTE — Care Management (Signed)
Personal care service list given to Donna Hayden. Discussed that she would need to personally call these agencies to discuss plans. Daughter at the bedside and will assist with making telephone calls Donna GreetBrenda S Jacques Willingham RN MSN CCM Care Management [phone]

## 2015-03-05 NOTE — Care Management (Signed)
Spoke with Oletta LamasBeth Childs [facility] [phone]). States has [facility] the [facility]  [facility] They can't accept this case.  States she will continue to try to arrange personal care services prior to Ms. Tolson discharge.  Physical therapy evaluation completed. Recommending Supervision assistance; Home Health physical therapy. Gwenette GreetBrenda S Allan Minotti RN MSN CCM Care Management [phone]

## 2015-03-05 NOTE — Anesthesia Preprocedure Evaluation (Deleted)
Anesthesia Evaluation  Patient identified by MRN, date of birth, ID band Patient awake    Reviewed: Allergy & Precautions, H&P , NPO status , Patient's Chart, lab work & pertinent test results, reviewed documented beta blocker date and time   Airway Mallampati: II  TM Distance: >3 FB Neck ROM: full    Dental no notable dental hx.    Pulmonary neg pulmonary ROS, shortness of breath, pneumonia, resolved,    Pulmonary exam normal breath sounds clear to auscultation       Cardiovascular Exercise Tolerance: Good negative cardio ROS   Rhythm:regular Rate:Normal     Neuro/Psych negative neurological ROS  negative psych ROS   GI/Hepatic negative GI ROS, Neg liver ROS,   Endo/Other  negative endocrine ROS  Renal/GU negative Renal ROS  negative genitourinary   Musculoskeletal   Abdominal   Peds  Hematology negative hematology ROS (+) anemia ,   Anesthesia Other Findings   Reproductive/Obstetrics negative OB ROS                             Anesthesia Physical Anesthesia Plan  ASA: II  Anesthesia Plan: General   Post-op Pain Management:    Induction:   Airway Management Planned:   Additional Equipment:   Intra-op Plan:   Post-operative Plan:   Informed Consent: I have reviewed the patients History and Physical, chart, labs and discussed the procedure including the risks, benefits and alternatives for the proposed anesthesia with the patient or authorized representative who has indicated his/her understanding and acceptance.   Dental Advisory Given  Plan Discussed with: CRNA  Anesthesia Plan Comments:         Anesthesia Quick Evaluation

## 2015-03-05 NOTE — Progress Notes (Signed)
Surgery Progress Note  S: Doing well.  Appears comfortable O:Blood pressure 97/67, pulse 100, temperature 97.9 F (36.6 C), temperature source Oral, resp. rate 18, height 5' (1.524 m), weight 56 lb (25.401 kg), SpO2 99 %. GEN: NAD/A&Ox3 RESP: weak cough, no air leak  CXR: Minimal right apical ptx  A/P 44 yo s/p tube thoracostomy for traumatic ptx - water seal - cxr in am

## 2015-03-05 NOTE — Consult Note (Signed)
WOC wound consult note Reason for Consult: Pressure Injury Sacral area, Unstageable due to the presence of non-removable eschar. Bilateral heels are intact. Bilateral ears are free of medical device related pressure injury (MDRPI).  Electrodes removed to prevent medical adhesive related skin injury (MARSI) Wound type:Pressure Pressure Ulcer POA: Yes Measurement: Sacral (Unstageable):  4cm x 2.2cm with depth obscured by the presence of eschar. Proximal to this injury is a 4cm x 3cm area of non-blanchable erythema (Stage1).  Mid-thoracic spine:  Reactive hyperemic area measuring 5cm x 3cm.   Wound bed: Sacral: 60% red, 40% soft black eschar Drainage (amount, consistency, odor) Serous, moderate amount Periwound: erythremic, no warmth or induration Dressing procedure/placement/frequency: All skin is dry; Nursing is provided with guidance for skin care and preventive measures.  Bilateral pressure redistribution heel boots are provided to prevent medial rotation and float heels. A mattress replacement is provided to pressure redistribution. An enzymatic debriding agent (Santyl, collagenase) is ordered to slowly dissolve the necrotic tissue at the sacral wound to reveal the true depth of the pressure injury. Preventive dressings are to be used over the protuberant thoracic spine. WOC nursing team will not follow, but will remain available to this patient, the nursing and medical teams.  Please re-consult if needed. Thanks, Ladona MowLaurie Teegan Guinther, MSN, RN, GNP, Hans EdenCWOCN, CWON-AP, FAAN  Pager# [phone]

## 2015-03-05 NOTE — Progress Notes (Signed)
I have seen and evaluated patient chest xray.  Will be by to assess patient later.  Order placed to place chest tube to water seal.

## 2015-03-05 NOTE — Progress Notes (Signed)
Nutrition Follow-up  DOCUMENTATION CODES:   Severe malnutrition in context of chronic illness  INTERVENTION:   Meals and Snacks: Cater to patient preferences once able to advance diet order a/p procedure Medical Food Supplement Therapy: recommend continuing as ordered once diet order able to be advanced Coordination of Care: recommend daily weights as last weight on admission   NUTRITION DIAGNOSIS:   Malnutrition related to chronic illness as evidenced by severe depletion of body fat, severe depletion of muscle mass.  GOAL:   Patient will meet greater than or equal to 90% of their needs; ongoing  MONITOR:    (Energy intake, Anthropometric)  REASON FOR ASSESSMENT:   Malnutrition Screening Tool, Consult Assessment of nutrition requirement/status  ASSESSMENT:    Pt scheduled for EGD this am secondary to odonyphagia. Pt remains with chest tube in place with water seal for ptx per surgery MD note.  Diet Order:  Diet NPO time specified   Current Nutrition: Pt NPO this am. Per report pt eating bites of meals yesterday. Recorded po intake <25% of meals consumed per documentation.    Gastrointestinal Profile: Last BM: 03/03/2015   Scheduled Medications:  . azithromycin  600 mg Oral Weekly  . collagenase   Topical Daily  . dapsone  100 mg Oral Daily  . fluconazole  200 mg Oral Daily  . heparin  5,000 Units Subcutaneous 3 times per day    Continuous Medications:  . sodium chloride 75 mL/hr at 03/05/15 0500     Electrolyte/Renal Profile and Glucose Profile:   Recent Labs Lab 03/03/15 0335 03/04/15 0430 03/05/15 0547  NA 133* 130* 126*  K 3.2* 2.8* 3.9  CL 109 106 103  CO2 22 19* 18*  BUN 11 7 9   CREATININE 0.54 0.45 0.49  CALCIUM 7.3* 7.3* 7.3*  MG 1.4* 1.6* 1.6*  GLUCOSE 84 82 71   Protein Profile:  Recent Labs Lab 03/01/15 2110  ALBUMIN 1.9*     Weight Trend since Admission: Filed Weights   03/01/15 1941  Weight: 56 lb (25.401 kg)    BMI:   Body mass index is 10.94 kg/(m^2).  Estimated Nutritional Needs:   Kcal:  BEE 1011 kcals (IF 1.0-1.2, AF 1.2) [id number] kcals/d.  Using IBW of 45kg  Protein:  (1.2-1.5 g/kg) 54-68 g/d Usign IbW of 45kg  Fluid:  (25-4130ml/kg) 1125-135050ml/d  EDUCATION NEEDS:   No education needs identified at this time   HIGH Care Level  Leda QuailAllyson Dylin Hayden, RD, LDN Pager [phone]

## 2015-03-05 NOTE — Progress Notes (Signed)
[facility] - [place] [facility]   PATIENT NAME: Donna LarssonStephanie Snuffer    MR#:  [id number]  DATE OF BIRTH:  12/29/1970  SUBJECTIVE:  CHIEF COMPLAINT:   Chief Complaint  Patient presents with  . Shoulder Injury   no complaint.  REVIEW OF SYSTEMS:  CONSTITUTIONAL: No fever, fatigue or weakness.  EYES: No blurred or double vision.  EARS, NOSE, AND THROAT: No tinnitus or ear pain.  RESPIRATORY: No cough, shortness of breath, wheezing or hemoptysis.  CARDIOVASCULAR: No chest pain, orthopnea, edema.  GASTROINTESTINAL: No nausea, vomiting, diarrhea or abdominal pain.  GENITOURINARY: No dysuria, hematuria.  ENDOCRINE: No polyuria, nocturia,  HEMATOLOGY: No anemia, easy bruising or bleeding SKIN: No rash or lesion. MUSCULOSKELETAL: Tenderness on the right shoulder.   NEUROLOGIC: No tingling, numbness, weakness.  PSYCHIATRY: No anxiety or depression.   DRUG ALLERGIES:   Allergies  Allergen Reactions  . Penicillins Hives  . Sulfa Antibiotics Nausea Only and Rash    Rash and nausea. No breathing issues    VITALS:  Blood pressure 97/67, pulse 100, temperature 97.9 F (36.6 C), temperature source Oral, resp. rate 18, height 5' (1.524 m), weight 25.401 kg (56 lb), SpO2 99 %.  PHYSICAL EXAMINATION:  GENERAL:  44 y.o.-year-old patient lying in the bed with no acute distress. Chronically ill-appearing and malnutrition status. EYES: Pupils equal, round, reactive to light and accommodation. No scleral icterus. Extraocular muscles intact.  HEENT: Head atraumatic, normocephalic. Oropharynx and nasopharynx clear.  NECK:  Supple, no jugular venous distention. No thyroid enlargement, no tenderness.  LUNGS: Normal breath sounds bilaterally, no wheezing, rales,rhonchi or crepitation. No use of accessory muscles of respiration. No air leak from chest tube suction. CARDIOVASCULAR: S1, S2 normal. No murmurs, rubs, or gallops.  ABDOMEN: Soft, nontender, nondistended. Bowel  sounds present. No organomegaly or mass.  EXTREMITIES: No pedal edema, cyanosis, or clubbing.  NEUROLOGIC: Cranial nerves II through XII are intact. Muscle strength 5/5 in all extremities. Sensation intact. Gait not checked.  PSYCHIATRIC: The patient is alert and oriented x 3.  SKIN: No obvious rash, lesion, or ulcer.    LABORATORY PANEL:   CBC  Recent Labs Lab 03/05/15 0547  WBC 2.8*  HGB 7.9*  HCT 24.9*  PLT 224   ------------------------------------------------------------------------------------------------------------------  Chemistries   Recent Labs Lab 03/01/15 2110  03/05/15 0547  NA 132*  < > 126*  K 3.3*  < > 3.9  CL 103  < > 103  CO2 26  < > 18*  GLUCOSE 91  < > 71  BUN 13  < > 9  CREATININE 0.61  < > 0.49  CALCIUM 8.3*  < > 7.3*  MG  --   < > 1.6*  AST 29  --   --   ALT 7*  --   --   ALKPHOS 36*  --   --   BILITOT 0.7  --   --   < > = values in this interval not displayed. ------------------------------------------------------------------------------------------------------------------  Cardiac Enzymes No results for input(s): TROPONINI in the last 168 hours. ------------------------------------------------------------------------------------------------------------------  RADIOLOGY:  Dg Chest 2 View  03/04/2015  CLINICAL DATA:  Follow-up of pneumo thorax, persistent slight cough and congestion. EXAM: CHEST  2 VIEW COMPARISON:  Portable chest x-ray of March 03, 2015 FINDINGS: The lungs remain hyperinflated. The small caliber chest tube tip remains coiled in the pulmonary apex. A small apical pneumothorax is visible with the pleural line lying between the second and third posterior ribs. There is  no mediastinal shift. The left lung exhibits coarse interstitial markings in the lower lobe posteriorly. Minimal blunting of the posterior costophrenic angles bilaterally likely reflects a trace pleural fluid. The cardiac silhouette remains mildly enlarged. The  pulmonary vascularity is normal. The bony thorax exhibits no acute abnormality. IMPRESSION: 1. Slight interval increase in the volume of the right apical pneumothorax which now amounts to 5-10% of the lung volume. Stable appearance of the small caliber right-sided chest tube whose tip lies in the apex just above the level of the pleural line. 2. Persistent interstitial infiltrate in the left lower lobe posteriorly. Trace pleural effusions blunt the posterior costophrenic angles. 3. Persistent cardiomegaly without pulmonary vascular congestion. 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 4. These results were called by telephone at the time of interpretation on 03/04/2015 at 7:51 am to Luretha Murphy, RN,, who verbally acknowledged these results. Electronically Signed   By: David  [place] M.D.   On: 03/04/2015 07:53   Dg Chest Port 1 View  03/05/2015  CLINICAL DATA:  Pneumothorax. EXAM: PORTABLE CHEST 1 VIEW COMPARISON:  03/04/2015. FINDINGS: Right chest tube in stable position. Interim near complete resolution of small right apical pneumothorax. Left lower lobe infiltrate. Small left pleural effusion. Heart size normal. No acute bony abnormality. IMPRESSION: 1. Right chest tube in stable position. Interval near complete resolution of tiny right apical pneumothorax. 2. Left lower lobe mild infiltrate. Electronically Signed   By: Maisie Fus  Register   On: 03/05/2015 07:19    EKG:  No orders found for this or any previous visit.  ASSESSMENT AND PLAN:   1, Right-sided pneumothorax.  continue chest tube, chest x-ray tomorrow per Dr. Sharmon Revere.  2. Hyponatremia. Continue normal saline IV, follow-up BMP. 3. Hypokalemia.  Improved after potassium supplement.  * Hypomagnesemia. Give IV magnesium and follow-up level.  4. 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 CD 4 ct 29 in Sept 2016  *Severe malnutrition. Follow-up Dietitian consult. Need GI consult for odynophagia and  weight loss per Dr. Sampson Goon. Per Dr. Shelle Iron, cannot get EGD due to hyponatremia per anesthesiologist.  *Anemia of chronic disease. Stable. Hemoglobin is stable. * Neutropenia. WBC fluctuated.  Discussed with Dr. Sharmon Revere and Dr. Shelle Iron. All the records are reviewed and case discussed with Care Management/Social Workerr. Management plans discussed with the patient, her daughter and they are in agreement. Greater than 50% time was spent on coordination of care and face-to-face counseling.  CODE STATUS: Full code  TOTAL TIME TAKING CARE OF THIS PATIENT: 39 minutes.   POSSIBLE D/C IN 2 DAYS, DEPENDING ON CLINICAL CONDITION.   Shaune Pollack M.D on 03/05/2015 at 4:23 PM  Between 7am to 6pm - Pager - [phone]  After 6pm go to [email or web address] - password [email or web address] [facility]  [facility]  Office  [phone]  CC: Primary care physician; WOODS, Garnette Czech, MD

## 2015-03-06 ENCOUNTER — Inpatient Hospital Stay: Payer: Medicare Other

## 2015-03-06 ENCOUNTER — Encounter: Payer: Self-pay | Admitting: Anesthesiology

## 2015-03-06 LAB — BASIC METABOLIC PANEL
Anion gap: 4 — ABNORMAL LOW (ref 5–15)
BUN: 7 mg/dL (ref 6–20)
CALCIUM: 7.4 mg/dL — AB (ref 8.9–10.3)
CO2: 20 mmol/L — ABNORMAL LOW (ref 22–32)
CREATININE: 0.46 mg/dL (ref 0.44–1.00)
Chloride: 106 mmol/L (ref 101–111)
GFR calc Af Amer: >60 mL/min (ref >60)
GLUCOSE: 91 mg/dL (ref 65–99)
POTASSIUM: 3.6 mmol/L (ref 3.5–5.1)
SODIUM: 130 mmol/L — AB (ref 135–145)

## 2015-03-06 LAB — GLUCOSE, CAPILLARY
GLUCOSE-CAPILLARY: 89 mg/dL (ref 65–99)
Glucose-Capillary: 82 mg/dL (ref 65–99)
Glucose-Capillary: 63 mg/dL — ABNORMAL LOW (ref 65–99)

## 2015-03-06 LAB — T-HELPER CELLS CD4/CD8 %
% CD 4 POS. LYMPH.: 8.4 % — AB (ref 30.8–58.5)
ABSOLUTE CD 4 HELPER: 17 /uL — AB (ref 359–1519)
BASOS: 1 %
Basophils Absolute: 0 10*3/uL (ref 0.0–0.2)
CD3+CD4+ Cells/CD3+CD8+ Cells Bld: 0.14 — ABNORMAL LOW (ref 0.92–3.72)
CD3+CD8+ CELLS # BLD: 117 /uL (ref 109–897)
CD3+CD8+ Cells NFr Bld: 58.3 % — ABNORMAL HIGH (ref 12.0–35.5)
EOS (ABSOLUTE): 0 10*3/uL (ref 0.0–0.4)
Eos: 0 %
HEMATOCRIT: 25.4 % — AB (ref 34.0–46.6)
Hemoglobin: 7.8 g/dL — ABNORMAL LOW (ref 11.1–15.9)
LYMPHS ABS: 0.2 10*3/uL — AB (ref 0.7–3.1)
LYMPHS: 6 %
MCH: 25.8 pg — AB (ref 26.6–33.0)
MCHC: 30.7 g/dL — AB (ref 31.5–35.7)
MCV: 84 fL (ref 79–97)
MONOS ABS: 0 10*3/uL — AB (ref 0.1–0.9)
Monocytes: 1 %
NEUTROS PCT: 92 %
Neutrophils Absolute: 2.9 10*3/uL (ref 1.4–7.0)
Platelets: 260 10*3/uL (ref 150–379)
RBC: 3.02 x10E6/uL — AB (ref 3.77–5.28)
RDW: 17.4 % — AB (ref 12.3–15.4)
WBC: 3.1 10*3/uL — AB (ref 3.4–10.8)

## 2015-03-06 LAB — MAGNESIUM: MAGNESIUM: 1.5 mg/dL — AB (ref 1.7–2.4)

## 2015-03-06 LAB — URINALYSIS COMPLETE WITH MICROSCOPIC (ARMC ONLY)
BILIRUBIN URINE: NEGATIVE
GLUCOSE, UA: NEGATIVE mg/dL
KETONES UR: NEGATIVE mg/dL
NITRITE: POSITIVE — AB
PH: 5 (ref 5.0–8.0)
Protein, ur: NEGATIVE mg/dL
Specific Gravity, Urine: 1.012 (ref 1.005–1.030)
Squamous Epithelial / LPF: NONE SEEN

## 2015-03-06 MED ORDER — HALOPERIDOL LACTATE 5 MG/ML IJ SOLN
5.0000 mg | Freq: Once | INTRAMUSCULAR | Status: AC
  Administered 2015-03-06: 06:00:00 5 mg via INTRAVENOUS
  Filled 2015-03-06: qty 1

## 2015-03-06 MED ORDER — DEXTROSE 5 % IV SOLN
1.0000 g | Freq: Two times a day (BID) | INTRAVENOUS | Status: DC
  Administered 2015-03-06 – 2015-03-08 (×5): 1 g via INTRAVENOUS
  Filled 2015-03-06 (×7): qty 1

## 2015-03-06 MED ORDER — SODIUM CHLORIDE 0.9 % IV SOLN
500.0000 mg | Freq: Once | INTRAVENOUS | Status: AC
  Administered 2015-03-06: 500 mg via INTRAVENOUS
  Filled 2015-03-06: qty 500

## 2015-03-06 MED ORDER — SODIUM CHLORIDE 0.9 % IV BOLUS (SEPSIS)
500.0000 mL | Freq: Once | INTRAVENOUS | Status: AC
  Administered 2015-03-06: 14:00:00 500 mL via INTRAVENOUS

## 2015-03-06 MED ORDER — MAGNESIUM SULFATE 4 GM/100ML IV SOLN
4.0000 g | Freq: Once | INTRAVENOUS | Status: AC
  Administered 2015-03-06: 4 g via INTRAVENOUS
  Filled 2015-03-06: qty 100

## 2015-03-06 MED ORDER — VANCOMYCIN HCL 500 MG IV SOLR
500.0000 mg | INTRAVENOUS | Status: DC
  Administered 2015-03-07: 500 mg via INTRAVENOUS
  Filled 2015-03-06 (×2): qty 500

## 2015-03-06 MED ORDER — DEXTROSE-NACL 5-0.9 % IV SOLN
INTRAVENOUS | Status: DC
  Administered 2015-03-06 – 2015-03-11 (×10): via INTRAVENOUS

## 2015-03-06 NOTE — Plan of Care (Addendum)
Problem: Safety: Goal: Ability to remain free from injury will improve Outcome: Progressing High fall risk interventions maintained. Pt remained free of injury.  Problem: Health Behavior/Discharge Planning: Goal: Ability to manage health-related needs will improve Outcome: Progressing Pt is dependent, but Able to express needs and involved in the plan of care.  Problem: Pain Managment: Goal: General experience of comfort will improve Outcome: Progressing Pt denies pain. Repositioned qx2hrs and prn.  Dressing to pressure ulcer changed per order. Poor appetite. CBG 63, milkshake given.Pt declined OJ. CBG up to 89. MD aware. Dextrose5%NaCl0.9% IVF initiated.  Pt was lethargic and drowsy this am, O2 sats 85% on 2L O2 and had fever. Increased O2 to 4L, O2 sats up to mid 90's. Tylenol supp given with improvement. Pt more awake during the shift. EGD canceled today. Bolus given for low BP. Currently BP 101/55.

## 2015-03-06 NOTE — Progress Notes (Signed)
GI Note:  EGD postponed again today due to tachycardia, hypoxia, fever this am.   She is high risk for anesthesia due to poor functional status and many comorbidities and will wait for optimal time to try EGD.  She reports odynophagia is much better recently so EGD is also less urgent.  Will consider another attempt at EGD on Friday.

## 2015-03-06 NOTE — Care Management Important Message (Signed)
Important Message  Patient Details  Name: Pricilla LarssonStephanie Laspina MRN: [id number] Date of Birth: 03/26/1971   Medicare Important Message Given:  Yes-third notification given    Gwenette GreetBrenda S Trendon Zaring, RN 03/06/2015, 8:09 AM

## 2015-03-06 NOTE — Progress Notes (Signed)
[facility] Date of [facility]:  03/01/2015     ID: Donna LarssonStephanie Hayden is a 44 y.o. female with HIV/AIDS, traumatic PTX  Principal Problem:   Pneumothorax on right Active Problems:   Protein-calorie malnutrition, severe   Pressure ulcer   Subjective: Higher temps. A little more lethargic. Is eating some. No diarrhea, breathing stable. Started on iv abx.   ROS  Eleven systems are reviewed and negative except per hpi  Medications:  Antibiotics Given (last 72 hours)    Date/Time Action Medication Dose Rate   03/04/15 1802 Given   dapsone tablet 100 mg 100 mg    03/05/15 1316 Given   dapsone tablet 100 mg 100 mg    03/06/15 1249 Given   cefTAZidime (FORTAZ) 1 g in dextrose 5 % 50 mL IVPB 1 g 100 mL/hr   03/06/15 1534 Given  [Bolus given.]   vancomycin (VANCOCIN) 500 mg in sodium chloride 0.9 % 100 mL IVPB 500 mg 100 mL/hr     . azithromycin  600 mg Oral Weekly  . cefTAZidime (FORTAZ)  IV  1 g Intravenous Q12H  . collagenase   Topical Daily  . dapsone  100 mg Oral Daily  . fluconazole  200 mg Oral Daily  . heparin  5,000 Units Subcutaneous 3 times per day  . sodium chloride  1 g Oral TID WC  . vancomycin  500 mg Intravenous Once  . [START ON 03/07/2015] vancomycin  500 mg Intravenous Q36H   Objective: Vital signs in last 24 hours: Temp:  [98 F (36.7 C)-102.9 F (39.4 C)] 98 F (36.7 C) (11/09 1338) Pulse Rate:  [107-140] 113 (11/09 1338) Resp:  [20-26] 20 (11/09 1338) BP: (86-113)/(48-67) 90/48 mmHg (11/09 1536) SpO2:  [85 %-95 %] 95 % (11/09 1338) Constitutional: Cachectic HENT: [place]/AT, PERRLA, no scleral icterus Mouth/Throat: Oropharynx is clear and moist. No oropharyngeal exudate.  Cardiovascular: Normal rate, regular rhythm and normal heart sounds. Pulmonary/Chest: dec bs R side, CT in place Neck = supple, no nuchal rigidity Abdominal: Soft. Bowel sounds are normal. exhibits no distension. There is no tenderness.   Lymphadenopathy: no cervical adenopathy. No axillary adenopathy Neurological: alert and oriented to person, place, and time.  Skin: Skin is warm and dry. No rash noted. No erythema.  Psychiatric: flat affect   Lab Results  Recent Labs  03/04/15 0430 03/05/15 0547 03/06/15 0452  WBC 4.2 3.1*  2.8*  --   HGB 7.2* 7.9*  --   HCT 22.7* 24.9*  25.4*  --   NA 130* 126* 130*  K 2.8* 3.9 3.6  CL 106 103 106  CO2 19* 18* 20*  BUN 7 9 7   CREATININE 0.45 0.49 0.46   Studies/Results: Dg Chest 2 View  03/06/2015  CLINICAL DATA:  Evaluate for pneumothorax. EXAM: CHEST  2 VIEW COMPARISON:  Chest radiograph 03/05/2015 FINDINGS: Stable position right chest tube, overlying the right lung apex. Stable enlarged cardiac mediastinal contours. Pulmonary vascular redistribution. Heterogeneous opacities left lower lobe. No definite residual right-sided pneumothorax. Small bilateral pleural effusions. IMPRESSION: Right-sided chest tube remains in place without significant residual pneumothorax. Residual heterogeneous opacities left lower lobe. Electronically Signed   By: Annia Beltrew  Davis M.D.   On: 03/06/2015 13:18   Dg Chest Port 1 View  03/05/2015  CLINICAL DATA:  Pneumothorax. EXAM: PORTABLE CHEST 1 VIEW COMPARISON:  03/04/2015. FINDINGS: Right chest tube in stable position. Interim near complete resolution of small right apical pneumothorax. Left lower lobe infiltrate. Small left pleural  effusion. Heart size normal. No acute bony abnormality. IMPRESSION: 1. Right chest tube in stable position. Interval near complete resolution of tiny right apical pneumothorax. 2. Left lower lobe mild infiltrate. Electronically Signed   By: Maisie Fus  Register   On: 03/05/2015 07:19    Assessment/Plan:  Donna Hayden is a 44 y.o. female with advanced AIDS CD 4 ct 29 in Sept 2016, off meds, previously followed at [facility] who was admitted with traumatic ptx after a fall. She has had odynophagia and has had work up as otpt but no  showed for multiple EGD attempts.  She has bene treated for thrush and is also on azithromycin weekly and started dapsone recently for PCP PX (Bactrim allergy, nml G6PD activity). She has multiple other medical issues, is quite frail, losing wt, largely immobile.  I reviewed her notes from [facility] and her prior labs.  She has not had an undetectable viral load in many years and her CD4 ct has continued to decline. I think she has a long history of non compliance as the most likely cause. Now with fevers, UA +. Some lower BP. Recommendations Continue azithro, fluconazole and dapsone UA + - agree with broad spectrum abx until cxs available. For EGD if stable enough but seems to be eating now Once can take po will start ART -  WIll need a great deal of care at home- lives only with her 69 year old daughter). Would be best if could be in a facility for 1-2 months to monitor while starting ART.  Thank you very much for the consult. Will follow with you.  Donna Hayden   03/06/2015, 4:14 PM

## 2015-03-06 NOTE — Plan of Care (Signed)
Problem: Nutrition: Goal: Adequate nutrition will be maintained Outcome: Progressing Plan of Care, progress to goals. 1. Pt remains safe and no injury. 2. Pt with some back pain and has good understanding of pain scale and pain measures available. 3. Pt with skin breakdown to sacrum and dressing intact.  Off-loading and frequent repositioning, heel boots applied. 4. Plan for EGD today.  NPO since midnight. Henriette CombsSarah Shatori Bertucci RN

## 2015-03-06 NOTE — Progress Notes (Signed)
PT Cancellation Note  Patient Details Name: Pricilla LarssonStephanie Tenpenny MRN: [id number] DOB: [birth year]   Cancelled Treatment:    Reason Eval/Treat Not Completed: Other (comment) (See PT note for further details) Per communication with nurse, pt lethargic and not feeling well. Nurse states today is not a good day for therapy. Will re-attempt at later time/date when more appropriate.    Benna DunksCasey Samiha Denapoli 03/06/2015, 4:06 PM  Benna Dunksasey Marialy Urbanczyk, SPT. [phone]

## 2015-03-06 NOTE — Progress Notes (Signed)
Surgery Progress Note  S: Tube to suction this am, was on water seal after I saw her yesterday.  No acute issues O:Blood pressure 107/62, pulse 133, temperature 98.8 F (37.1 C), temperature source Oral, resp. rate 22, height 5' (1.524 m), weight 56 lb (25.401 kg), SpO2 91 %. GEN: NAD/A&Ox3 CHEST: No air leak  A/P 44 yo s/p tube thoracostomy for traumatic ptx - water seal - chest xray later - possible d/c chest tube later

## 2015-03-06 NOTE — Progress Notes (Signed)
[facility] - [place] at [facility]   PATIENT NAME: Donna Hayden    MR#:  [id number]  DATE OF BIRTH:  04-Apr-1971  SUBJECTIVE:  CHIEF COMPLAINT:   Chief Complaint  Patient presents with  . Shoulder Injury   no complaint. Drowsy and lethargic this am. SAT 82% on [place] 2L this am, increased to 4L. Fever 102.3  REVIEW OF SYSTEMS:  CONSTITUTIONAL: No fever, fatigue or weakness.  EYES: No blurred or double vision.  EARS, NOSE, AND THROAT: No tinnitus or ear pain.  RESPIRATORY: No cough, shortness of breath, wheezing or hemoptysis.  CARDIOVASCULAR: No chest pain, orthopnea, edema.  GASTROINTESTINAL: No nausea, vomiting, diarrhea or abdominal pain.  GENITOURINARY: No dysuria, hematuria.  ENDOCRINE: No polyuria, nocturia,  HEMATOLOGY: No anemia, easy bruising or bleeding SKIN: No rash or lesion. MUSCULOSKELETAL: Tenderness on the right shoulder.   NEUROLOGIC: No tingling, numbness, weakness.  PSYCHIATRY: No anxiety or depression.   DRUG ALLERGIES:   Allergies  Allergen Reactions  . Penicillins Hives  . Sulfa Antibiotics Nausea Only and Rash    Rash and nausea. No breathing issues    VITALS:  Blood pressure 102/67, pulse 118, temperature 99.3 F (37.4 C), temperature source Oral, resp. rate 20, height 5' (1.524 m), weight 25.401 kg (56 lb), SpO2 93 %.  PHYSICAL EXAMINATION:  GENERAL:  44 y.o.-year-old patient lying in the bed with no acute distress. Chronically ill-appearing and malnutrition status. EYES: Pupils equal, round, reactive to light and accommodation. No scleral icterus. Extraocular muscles intact.  HEENT: Head atraumatic, normocephalic. Oropharynx and nasopharynx clear.  NECK:  Supple, no jugular venous distention. No thyroid enlargement, no tenderness.  LUNGS: Normal breath sounds bilaterally, no wheezing, but has crackles on left side. No use of accessory muscles of respiration. No air leak from chest tube suction. CARDIOVASCULAR: S1, S2  normal. No murmurs, rubs, or gallops.  ABDOMEN: Soft, nontender, nondistended. Bowel sounds present. No organomegaly or mass.  EXTREMITIES: No pedal edema, cyanosis, or clubbing.  NEUROLOGIC: Cranial nerves II through XII are intact. Muscle strength 4/5 in all extremities. Sensation intact. Gait not checked.  PSYCHIATRIC: The patient is alert and oriented x 3.  SKIN: No obvious rash, lesion, or ulcer.    LABORATORY PANEL:   CBC  Recent Labs Lab 03/05/15 0547  WBC 3.1*  2.8*  HGB 7.9*  HCT 24.9*  25.4*  PLT 224   ------------------------------------------------------------------------------------------------------------------  Chemistries   Recent Labs Lab 03/01/15 2110  03/06/15 0452  NA 132*  < > 130*  K 3.3*  < > 3.6  CL 103  < > 106  CO2 26  < > 20*  GLUCOSE 91  < > 91  BUN 13  < > 7  CREATININE 0.61  < > 0.46  CALCIUM 8.3*  < > 7.4*  MG  --   < > 1.5*  AST 29  --   --   ALT 7*  --   --   ALKPHOS 36*  --   --   BILITOT 0.7  --   --   < > = values in this interval not displayed. ------------------------------------------------------------------------------------------------------------------  Cardiac Enzymes No results for input(s): TROPONINI in the last 168 hours. ------------------------------------------------------------------------------------------------------------------  RADIOLOGY:  Dg Chest 2 View  03/06/2015  CLINICAL DATA:  Evaluate for pneumothorax. EXAM: CHEST  2 VIEW COMPARISON:  Chest radiograph 03/05/2015 FINDINGS: Stable position right chest tube, overlying the right lung apex. Stable enlarged cardiac mediastinal contours. Pulmonary vascular redistribution. Heterogeneous opacities left lower  lobe. No definite residual right-sided pneumothorax. Small bilateral pleural effusions. IMPRESSION: Right-sided chest tube remains in place without significant residual pneumothorax. Residual heterogeneous opacities left lower lobe. Electronically Signed    By: Annia Beltrew  Davis M.D.   On: 03/06/2015 13:18   Dg Chest Port 1 View  03/05/2015  CLINICAL DATA:  Pneumothorax. EXAM: PORTABLE CHEST 1 VIEW COMPARISON:  03/04/2015. FINDINGS: Right chest tube in stable position. Interim near complete resolution of small right apical pneumothorax. Left lower lobe infiltrate. Small left pleural effusion. Heart size normal. No acute bony abnormality. IMPRESSION: 1. Right chest tube in stable position. Interval near complete resolution of tiny right apical pneumothorax. 2. Left lower lobe mild infiltrate. Electronically Signed   By: Maisie Fushomas  Register   On: 03/05/2015 07:19    EKG:  No orders found for this or any previous visit.  ASSESSMENT AND PLAN:   1, Right-sided pneumothorax.  continue chest tube without suction, chest x-ray: Right-sided chest tube remains in place without significant residual pneumothorax. Residual heterogeneous opacities left lower lob today F/u  Dr. Sharmon RevereLindquist.  2. Hyponatremia. Improving, Continue normal saline IV, follow-up BMP. 3. Hypokalemia.  Improved after potassium supplement.  * Hypomagnesemia. Give IV magnesium and follow-up level.  4. 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 CD 4 ct 29 in Sept 2016  *Severe malnutrition. Follow-up Dietitian consult. Need GI consult for odynophagia and weight loss per Dr. Sampson GoonFitzgerald. Per Dr. Shelle Ironein, cannot get EGD due to hyponatremia and sepsis.  *Anemia of chronic disease. Stable. Hemoglobin is stable. * Neutropenia. Worsening, follow-up CBC.  *Septic shock with UTI and possible pneumonia (hypotension, tachycardia, leukopenia, AIDS). Normal saline bolus, sent blood culture and urine culture, start vancomycin and Zosyn, follow-up with Dr. Sampson GoonFitzgerald.  Discussed with Dr. Sharmon RevereLindquist and Dr. Sampson GoonFitzgerald. All the records are reviewed and case discussed with Care Management/Social Workerr. Management plans discussed with the patient, her daughter and they are in  agreement. Greater than 50% time was spent on coordination of care and face-to-face counseling.  CODE STATUS: Full code  TOTAL CRITICAL TIME TAKING CARE OF THIS PATIENT: 45 minutes.   POSSIBLE D/C IN ? DAYS, DEPENDING ON CLINICAL CONDITION.   Shaune Pollackhen, Lakya Schrupp M.D on 03/06/2015 at 1:37 PM  Between 7am to 6pm - Pager - [phone]  After 6pm go to [email or web address] - password [email or web address] [facility]  [facility]  Office  [phone]  CC: Primary care physician; WOODS, Garnette CzechHRISTOPHER WILDRICK, MD

## 2015-03-06 NOTE — Progress Notes (Signed)
Palliative Care Update   I am aware of this consult that came in today and have briefly reviewed the medical record.  Unfortunately, I cannot complete a full consult on this patient due to the high volume of consults (a number of them late in the day) .Also, I am going to be out of state for 5 days at a family event and [facility].  I will be glad to see her on Tuesday if the consult is still needed and if she is still here.    I note that she has been considered for having Home Health.    I have read also that Dr. Sampson GoonFitzgerald feels she would be best served by being in a facility setting for a couple of months.  She does have Medicare and Medicaid apparently, and this might be something that can be worked out if she will agree.    She is full code status. She is young and this may have been asked before and may be her choice.  We should address this soon, but I cannot do so this late evening.    If she does not do well, then other options can be considered.  If she should become a patient best served by Hospice, then a consult from the Hospice Liaison can be obtained via the care manager.   Dixie DialsMargaret F Cambpell, MD

## 2015-03-06 NOTE — Care Management (Signed)
Spoke with Ms. Donna Hayden's daughter at the bedside. Ms. Donna Hayden out of the room for EGD. Ms. Donna Hayden did speak with representative for [facility] yesterday and they have agreed to take her case.  [facility]phone to [facility] [phone]). Spoke with Clide CliffMargaret Williams at [facility]. State that [facility]  Will take Ms.Zipp case. Left voice mail for Oletta LamasBeth Childs SW at [facility] [phone]

## 2015-03-06 NOTE — Progress Notes (Signed)
ANTIBIOTIC CONSULT NOTE - INITIAL  Pharmacy Consult for Vancomycin/Ceftazidime Indication: rule out sepsis  Allergies  Allergen Reactions  . Penicillins Hives  . Sulfa Antibiotics Nausea Only and Rash    Rash and nausea. No breathing issues    Patient Measurements: Height: 5' (152.4 cm) Weight: 56 lb (25.401 kg) IBW/kg (Calculated) : 45.5   Vital Signs: Temp: 99.3 F (37.4 C) (11/09 1015) Temp Source: Oral (11/09 1015) BP: 102/67 mmHg (11/09 0839) Pulse Rate: 118 (11/09 0842) Intake/Output from previous day: 11/08 0701 - 11/09 0700 In: 1811 [I.V.:1811] Out: 1535 [Urine:1500; Chest Tube:35] Intake/Output from this shift:    Labs:  Recent Labs  03/04/15 0430 03/05/15 0547 03/06/15 0452  WBC 4.2 2.8*  --   HGB 7.2* 7.9*  --   PLT 208 224  --   CREATININE 0.45 0.49 0.46   Estimated Creatinine Clearance: 35.2 mL/min (by C-G formula based on Cr of 0.46). No results for input(s): VANCOTROUGH, VANCOPEAK, VANCORANDOM, GENTTROUGH, GENTPEAK, GENTRANDOM, TOBRATROUGH, TOBRAPEAK, TOBRARND, AMIKACINPEAK, AMIKACINTROU, AMIKACIN in the last 72 hours.   Microbiology: No results found for this or any previous visit (from the past 720 hour(s)).  Medical History: Past Medical History  Diagnosis Date  . HIV (human immunodeficiency virus infection) (HCC)   . Anemia     Iron Deficient  . Osteomyelitis (HCC) Bilateral Femoral  . Parotid cyst   . Pneumonia   . Pulmonary hypertension (HCC)   . Selective deficiency of IgG subclasses (HCC)   . Septic arthritis (HCC)   . Sickle cell trait (HCC)   . Shortness of breath dyspnea     Medications:  Scheduled:  . azithromycin  600 mg Oral Weekly  . cefTAZidime (FORTAZ)  IV  1 g Intravenous Q12H  . collagenase   Topical Daily  . dapsone  100 mg Oral Daily  . fluconazole  200 mg Oral Daily  . heparin  5,000 Units Subcutaneous 3 times per day  . sodium chloride  1 g Oral TID WC  . vancomycin  500 mg Intravenous Once  . [START ON  03/07/2015] vancomycin  500 mg Intravenous Q36H   Assessment: Pharmacy consulted to dose Vancomycin and Ceftazidime in a 44 yo female with history of HIV.  Patient with fever this AM.  Blood cultures ordered and empiric antibiotics desired for sepsis.   SCr: 0.46 (0.8), est CrCl~35.2 mL/min, ke: 0.034, t1/2: 20.38  Goal of Therapy:  Vancomycin trough level 15-20 mcg/ml  Plan:  Will order Vancomycin 500 mg IV once with stacked dose ~ 20 hours later.  Will then start maintenance dosing of 500 mg IV q36h.  Will check trough prior to dose on 11/13 at 0730 (close to steady state).    Will start patient on Ceftazidime 1 gm IV q12h based on renal function.  MD aware of PCN allergy.   Pharmacy will continue to follow.  Shariece Viveiros G 03/06/2015,11:36 AM

## 2015-03-07 ENCOUNTER — Inpatient Hospital Stay: Payer: Medicare Other

## 2015-03-07 ENCOUNTER — Encounter
Admission: EM | Disposition: A | Discharge status: Home / Self Care | Payer: Self-pay | Source: Home / Self Care | Attending: Internal Medicine

## 2015-03-07 LAB — CBC
HEMATOCRIT: 21 % — AB (ref 35.0–47.0)
HEMOGLOBIN: 6.7 g/dL — AB (ref 12.0–16.0)
MCH: 26.9 pg (ref 26.0–34.0)
MCHC: 32.1 g/dL (ref 32.0–36.0)
MCV: 83.6 fL (ref 80.0–100.0)
Platelets: 204 10*3/uL (ref 150–440)
RBC: 2.51 MIL/uL — AB (ref 3.80–5.20)
RDW: 18 % — ABNORMAL HIGH (ref 11.5–14.5)
WBC: 3.4 10*3/uL — ABNORMAL LOW (ref 3.6–11.0)

## 2015-03-07 LAB — BASIC METABOLIC PANEL
ANION GAP: 4 — AB (ref 5–15)
BUN: <5 mg/dL — ABNORMAL LOW (ref 6–20)
CHLORIDE: 102 mmol/L (ref 101–111)
CO2: 20 mmol/L — AB (ref 22–32)
Calcium: 6.8 mg/dL — ABNORMAL LOW (ref 8.9–10.3)
Creatinine, Ser: 0.42 mg/dL — ABNORMAL LOW (ref 0.44–1.00)
GFR calc non Af Amer: >60 mL/min (ref >60)
GLUCOSE: 124 mg/dL — AB (ref 65–99)
POTASSIUM: 2.8 mmol/L — AB (ref 3.5–5.1)
Sodium: 126 mmol/L — ABNORMAL LOW (ref 135–145)

## 2015-03-07 LAB — PREPARE RBC (CROSSMATCH)

## 2015-03-07 LAB — ABO/RH: ABO/RH(D): B POS

## 2015-03-07 LAB — MAGNESIUM: Magnesium: 1.5 mg/dL — ABNORMAL LOW (ref 1.7–2.4)

## 2015-03-07 SURGERY — ESOPHAGOGASTRODUODENOSCOPY (EGD) WITH PROPOFOL
Anesthesia: General

## 2015-03-07 MED ORDER — METOPROLOL TARTRATE 25 MG PO TABS
25.0000 mg | ORAL_TABLET | Freq: Two times a day (BID) | ORAL | Status: DC | PRN
  Administered 2015-03-07: 25 mg via ORAL
  Filled 2015-03-07: qty 1

## 2015-03-07 MED ORDER — POTASSIUM CHLORIDE CRYS ER 20 MEQ PO TBCR
60.0000 meq | EXTENDED_RELEASE_TABLET | Freq: Two times a day (BID) | ORAL | Status: AC
Start: 2015-03-07 — End: 2015-03-07
  Administered 2015-03-07 (×2): 60 meq via ORAL
  Filled 2015-03-07 (×2): qty 3

## 2015-03-07 MED ORDER — ELVITEG-COBIC-EMTRICIT-TENOFAF 150-150-200-10 MG PO TABS
1.0000 | ORAL_TABLET | Freq: Every day | ORAL | Status: DC
  Administered 2015-03-08 – 2015-03-11 (×3): 1 via ORAL
  Filled 2015-03-07 (×7): qty 1

## 2015-03-07 MED ORDER — BISACODYL 10 MG RE SUPP
10.0000 mg | Freq: Once | RECTAL | Status: AC
  Administered 2015-03-07: 10 mg via RECTAL
  Filled 2015-03-07: qty 1

## 2015-03-07 MED ORDER — ELVITEG-COBIC-EMTRICIT-TENOFAF 150-150-200-10 MG PO TABS: 1.0000 | ORAL_TABLET | Freq: Every day | ORAL | Status: DC

## 2015-03-07 MED ORDER — SODIUM CHLORIDE 0.9 % IV SOLN
Freq: Once | INTRAVENOUS | Status: AC
  Administered 2015-03-07: 14:00:00 via INTRAVENOUS

## 2015-03-07 MED ORDER — MAGNESIUM SULFATE 4 GM/100ML IV SOLN
4.0000 g | Freq: Once | INTRAVENOUS | Status: AC
  Administered 2015-03-07: 4 g via INTRAVENOUS
  Filled 2015-03-07: qty 100

## 2015-03-07 MED ORDER — DARUNAVIR ETHANOLATE 800 MG PO TABS
800.0000 mg | ORAL_TABLET | Freq: Every day | ORAL | Status: DC
  Administered 2015-03-08 – 2015-03-11 (×3): 800 mg via ORAL
  Filled 2015-03-07 (×7): qty 1

## 2015-03-07 MED ORDER — ACETAMINOPHEN 325 MG PO TABS
650.0000 mg | ORAL_TABLET | Freq: Once | ORAL | Status: AC
  Administered 2015-03-07: 650 mg via ORAL
  Filled 2015-03-07: qty 2

## 2015-03-07 MED ORDER — CETYLPYRIDINIUM CHLORIDE 0.05 % MT LIQD
7.0000 mL | Freq: Two times a day (BID) | OROMUCOSAL | Status: DC
  Administered 2015-03-08 – 2015-03-10 (×5): 7 mL via OROMUCOSAL

## 2015-03-07 NOTE — Progress Notes (Signed)
PT Cancellation Note  Patient Details Name: Pricilla LarssonStephanie Crom MRN: [id number] DOB: 11-08-1970   Cancelled Treatment:    Reason Eval/Treat Not Completed: Other (comment). Pt currently not medically stable for therapy at this time. Pt with increased HR to 154, Hgb at 6.7 (to receive blood this date), and decreased K+ to 2.8. Will hold treatment at this time and re-attempt at later date when medically stable.   Thijs Brunton,Sani 03/07/2015, 8:46 AM Elizabeth PalauStephanie Sherika Kubicki, PT, DPT [phone]

## 2015-03-07 NOTE — Progress Notes (Signed)
ID E note No fevers but HR up.  Sats 93% on 4 L.  BCX UCx neg  Rec Sepsis- cont vanco and ceftazidime pending culture results  HIV - will restart ART today Ordered Darunavir and Genvoya. Monitor for IRIS as very high risk  PTX and infiltrate on CXR Check CT scan chest  Prophylaxis Cont weely azithro (for  MAI),dapsone (for PCP)  Dysphagia- responded to prolonged fluc so likely candidal esophagitis. Can postpone EGD to otpt

## 2015-03-07 NOTE — Progress Notes (Signed)
[facility] [place] at [facility]   PATIENT NAME: Pricilla LarssonStephanie Wierzba    MR#:  [id number]  DATE OF BIRTH:  [birth year]  SUBJECTIVE:  CHIEF COMPLAINT:   Chief Complaint  Patient presents with  . Shoulder Injury   no complaint. on [place] 3L.  REVIEW OF SYSTEMS:  CONSTITUTIONAL: No fever, fatigue or weakness.  EYES: No blurred or double vision.  EARS, NOSE, AND THROAT: No tinnitus or ear pain.  RESPIRATORY: No cough, shortness of breath, wheezing or hemoptysis.  CARDIOVASCULAR: No chest pain, orthopnea, edema.  GASTROINTESTINAL: No nausea, vomiting, diarrhea or abdominal pain.  GENITOURINARY: No dysuria, hematuria.  ENDOCRINE: No polyuria, nocturia,  HEMATOLOGY: No anemia, easy bruising or bleeding SKIN: No rash or lesion. MUSCULOSKELETAL: Tenderness on the right shoulder.   NEUROLOGIC: No tingling, numbness, weakness.  PSYCHIATRY: No anxiety or depression.   DRUG ALLERGIES:   Allergies  Allergen Reactions  . Penicillins Hives  . Sulfa Antibiotics Nausea Only and Rash    Rash and nausea. No breathing issues    VITALS:  Blood pressure 86/48, pulse 100, temperature 98.1 F (36.7 C), temperature source Oral, resp. rate 20, height 5' (1.524 m), weight 25.401 kg (56 lb), SpO2 98 %.  PHYSICAL EXAMINATION:  GENERAL:  44 y.o.-year-old patient lying in the bed with no acute distress. Chronically ill-appearing and malnutrition status. EYES: Pupils equal, round, reactive to light and accommodation. No scleral icterus. Extraocular muscles intact.  HEENT: Head atraumatic, normocephalic. Oropharynx and nasopharynx clear.  NECK:  Supple, no jugular venous distention. No thyroid enlargement, no tenderness.  LUNGS: Normal breath sounds bilaterally, no wheezing, but has crackles on left side. No use of accessory muscles of respiration. No air leak from chest tube suction. CARDIOVASCULAR: S1, S2 normal. No murmurs, rubs, or gallops.  ABDOMEN: Soft, nontender,  nondistended. Bowel sounds present. No organomegaly or mass.  EXTREMITIES: No pedal edema, cyanosis, or clubbing.  NEUROLOGIC: Cranial nerves II through XII are intact. Muscle strength 4/5 in all extremities. Sensation intact. Gait not checked.  PSYCHIATRIC: The patient is alert and oriented x 3.  SKIN: Sacral DU stage III.   LABORATORY PANEL:   CBC  Recent Labs Lab 03/07/15 0630  WBC 3.4*  HGB 6.7*  HCT 21.0*  PLT 204   ------------------------------------------------------------------------------------------------------------------  Chemistries   Recent Labs Lab 03/01/15 2110  03/07/15 0630  NA 132*  < > 126*  K 3.3*  < > 2.8*  CL 103  < > 102  CO2 26  < > 20*  GLUCOSE 91  < > 124*  BUN 13  < > <5*  CREATININE 0.61  < > 0.42*  CALCIUM 8.3*  < > 6.8*  MG  --   < > 1.5*  AST 29  --   --   ALT 7*  --   --   ALKPHOS 36*  --   --   BILITOT 0.7  --   --   < > = values in this interval not displayed. ------------------------------------------------------------------------------------------------------------------  Cardiac Enzymes No results for input(s): TROPONINI in the last 168 hours. ------------------------------------------------------------------------------------------------------------------  RADIOLOGY:  Dg Chest 2 View  03/06/2015  CLINICAL DATA:  Evaluate for pneumothorax. EXAM: CHEST  2 VIEW COMPARISON:  Chest radiograph 03/05/2015 FINDINGS: Stable position right chest tube, overlying the right lung apex. Stable enlarged cardiac mediastinal contours. Pulmonary vascular redistribution. Heterogeneous opacities left lower lobe. No definite residual right-sided pneumothorax. Small bilateral pleural effusions. IMPRESSION: Right-sided chest tube remains in place without significant residual pneumothorax.  Residual heterogeneous opacities left lower lobe. Electronically Signed   By: Annia Belt M.D.   On: 03/06/2015 13:18   Ct Chest Wo Contrast  03/07/2015  CLINICAL  DATA:  Pneumothorax. Left lower lobe indistinct opacities on chest radiography. Right pleural drainage tube. HIV. Sickle cell trait. Pneumonia. EXAM: CT CHEST WITHOUT CONTRAST TECHNIQUE: Multidetector CT imaging of the chest was performed following the standard protocol without IV contrast. COMPARISON:  03/06/2015 FINDINGS: Mediastinum/Nodes: Cachexia. Poor definition of fat planes, possible third spacing of fluid contributing. Low-density blood pool compatible with anemia. No definite adenopathy but reduced negative predictive value for mild to moderate adenopathy due to the ill definition of fat planes. Lungs/Pleura: Right pleural drainage catheter in place. No residual pneumothorax. Airspace opacities in both lower lobes, left greater than right, especially peripherally, with some surrounding patchy ground-glass opacities which are ill-defined. Upper abdomen: Poor definition of fat planes. Musculoskeletal: Unremarkable IMPRESSION: 1. Bibasilar airspace opacities, left greater than right, suspicious for pneumonia. 2. No residual pneumothorax.  Right pleural drain in place. 3. Cachexia. Severe ill definition of fat planes potentially from third spacing of fluid. Electronically Signed   By: Gaylyn Rong M.D.   On: 03/07/2015 11:05    EKG:  No orders found for this or any previous visit.  ASSESSMENT AND PLAN:   1, Right-sided pneumothorax.  Removed chest tube by Dr. Sharmon Revere. Repeated chest x-ray: No pneumothorax  But chest CT show bilateral lower lobe opacity, left side is worse than right side.  2. Hyponatremia. Worsening,  Continue D5-normal saline IV, follow-up BMP. 3. Hypokalemia. IV and by mouth potassium supplement. Follow up BMP. * Hypomagnesemia. Give IV magnesium and follow-up level.  4. 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 CD 4 ct 29 in Sept 2016. CD3+CD4+cells/CD3+CD8+Cells: 0.14.  *Severe malnutrition. Follow-up Dietitian. Need GI consult for  odynophagia and weight loss per Dr. Sampson Goon. Per Dr. Shelle Iron, cannot get EGD due to hyponatremia and sepsis.  *Anemia of chronic disease. Hemoglobin decreased to 6.7. Get 1 unit PRBC transfusion. Follow-up hemoglobin * Neutropenia. better, follow-up CBC.  *Septic shock with UTI and pneumonia (hypotension, tachycardia, leukopenia, AIDS). Hypotension is better but still low side,  tachycardia improved, continue D5-normal saline IV, continue vancomycin and ceftazidime per  Dr. Sampson Goon. Follow-up blood culture and urine culture (GNR).   *Hypoglycemia yesterday. Changed to D5 normal saline IV. Oral intake is better today. *Sacral DU stage III. Wound care.  The patient needs hospice care at home.  Discussed with Dr. Sharmon Revere and Dr. Sampson Goon. All the records are reviewed and case discussed with Care Management/Social Workerr. Management plans discussed with the patient, her daughter and they are in agreement. Greater than 50% time was spent on coordination of care and face-to-face counseling.  CODE STATUS: Full code  TOTAL TIME TAKING CARE OF THIS PATIENT: 48 minutes.   POSSIBLE D/C IN ? DAYS, DEPENDING ON CLINICAL CONDITION.   Shaune Pollack M.D on 03/07/2015 at 2:45 PM  Between 7am to 6pm - Pager - [phone]  After 6pm go to [email or web address] - password [email or web address] [facility]  [facility]  Office  [phone]  CC: Primary care physician; WOODS, Garnette Czech, MD

## 2015-03-07 NOTE — Progress Notes (Signed)
Surgery Progress Note  Chest tube removed without difficulty.  Will obtain postpull xray in 3 hours.

## 2015-03-07 NOTE — Clinical Documentation Improvement (Signed)
Internal Medicine  Can the diagnosis of pressure ulcer Noted 03/05/15 be further specified?   Document if pressure ulcer with stage is Present on Admission   Document Site with laterality - Elbow, Back (upper/lower), Sacral, Hip, Buttock, Ankle, Heel, Head, Other (Specify)  Pressure Ulcer Stage - Stage1, Stage 2, Stage 3, Stage 4, Unstageable, Unspecified, Unable to Clinically Determine  Other  Clinically Undetermined  Supporting Information: -- Note 11/8 documents "pressure ulcer"  Please exercise your independent, professional judgment when responding. A specific answer is not anticipated or expected.  Thank You,  Beverley FiedlerLaurie E Alorah Mcree RN [facility] Health Information Management [place] [phone]

## 2015-03-07 NOTE — Care Management (Signed)
Telephone call to Oletta LamasBeth Childs SW at [facility]. States that she contact [facility] and see if they can determine how many personal care service hours can ce arranged for Ms. Zettlemoyer when discharged. Palliative Care representative visited  Ms. Hurless yesterday. Will discuss Hospice agencies with Ms. Crock today. Unable to complete EGD yesterday. Temperature = 102.9. IV Vancomycin and Theophilus KindsFortaz Munir [place] S Karyl Sharrar RN MSN CCM Care Management [phone]

## 2015-03-07 NOTE — Progress Notes (Signed)
Pt heart rate in the 150's. Notified Dr Enedina FinnerSona Patel. Order given for Metoprolol 25 mg po bid prn for systolic > 120. Also order given for cardiac monitor. Will continue to monitor.Geri Seminolerusilla A Jackson, RN

## 2015-03-07 NOTE — Progress Notes (Signed)
Surgery Progress Note  S: No acute issues O:Blood pressure 125/64, pulse 153, temperature 98.5 F (36.9 C), temperature source Oral, resp. rate 20, height 5' (1.524 m), weight 56 lb (25.401 kg), SpO2 93 %. GEN: NAD/A&Ox3 RESP: No air leak.  A/P 44 yo s/p traumatic ptx - cxr - possible d/c chest tube if lung still up

## 2015-03-07 NOTE — Progress Notes (Signed)
GI Inpatient Follow-up Note  Patient Identification: Pricilla LarssonStephanie Overacker is a 44 y.o. female with advanced AIDS, odynophagia.   Subjective:  Less somnolent today. Swallowing without difficulty.  No f/c.  Still somewhat SOB.  Ctube removed.   Scheduled Inpatient Medications:  . antiseptic oral rinse  7 mL Mouth Rinse q12n4p  . azithromycin  600 mg Oral Weekly  . cefTAZidime (FORTAZ)  IV  1 g Intravenous Q12H  . collagenase   Topical Daily  . dapsone  100 mg Oral Daily  . darunavir  800 mg Oral Q breakfast  . elvitegravir-cobicistat-emtricitabine-tenofovir  1 tablet Oral Q breakfast  . fluconazole  200 mg Oral Daily  . heparin  5,000 Units Subcutaneous 3 times per day  . potassium chloride  60 mEq Oral BID  . sodium chloride  1 g Oral TID WC  . vancomycin  500 mg Intravenous Q36H    Continuous Inpatient Infusions:   . dextrose 5 % and 0.9% NaCl 125 mL/hr at 03/07/15 0740    PRN Inpatient Medications:  acetaminophen **OR** acetaminophen, ipratropium-albuterol, metoprolol tartrate, morphine injection, ondansetron **OR** ondansetron (ZOFRAN) IV, oxyCODONE    Physical Examination: BP 98/51 mmHg  Pulse 93  Temp(Src) 98.2 F (36.8 C) (Oral)  Resp 20  Ht 5' (1.524 m)  Wt 25.401 kg (56 lb)  BMI 10.94 kg/m2  SpO2 98%  LMP  Gen: NAD, alert and oriented x 3, chronically ill appearing with evidence of severe malnutrition.  Neck: supple, no JVD or thyromegaly CV: RRR, no m/g/c/r Abd: soft, NT, ND, +BS in all four quadrants; no HSM, guarding, ridigity, or rebound tenderness Ext: no edema, well perfused with 2+ pulses, Skin: no rash or lesions noted    Data: Lab Results  Component Value Date   WBC 3.4* 03/07/2015   HGB 6.7* 03/07/2015   HCT 21.0* 03/07/2015   MCV 83.6 03/07/2015   PLT 204 03/07/2015    Recent Labs Lab 03/04/15 0430 03/05/15 0547 03/07/15 0630  HGB 7.2* 7.9* 6.7*   Lab Results  Component Value Date   NA 126* 03/07/2015   K 2.8* 03/07/2015   CL 102  03/07/2015   CO2 20* 03/07/2015   BUN <5* 03/07/2015   CREATININE 0.42* 03/07/2015   Lab Results  Component Value Date   ALT 7* 03/01/2015   AST 29 03/01/2015   ALKPHOS 36* 03/01/2015   BILITOT 0.7 03/01/2015    Recent Labs Lab 03/01/15 2110  INR 1.18   Assessment/Plan: Ms. Nunzio CoryLyons is a 44 y.o. female with severe AIDS, recent PTX, now with Pna.  GI consulted for odynophagia which is much better since starting fluconazole again.    Recommendations:   Since she currently has a Pneumonia, on 4L 02,  and swallwing is much improved, will wait for optimal time to perform EGD since will be high risk for anesthesia.  This will likely be as outpatient once Pna and breathing have improved or next week if still inpatient.   Please call with questions or concerns.  REIN, Addison NaegeliMATTHEW GORDON, MD

## 2015-03-07 NOTE — Plan of Care (Signed)
Problem: Pain Managment: Goal: General experience of comfort will improve Outcome: Progressing Pt with some complaints of discomfort during the night. Repositioned often. Pt declined pain medication. Pt heart rate elevated earlier (see previous progress note). Metoprolol given. Heart rate improved to around 117 from 150's.

## 2015-03-07 NOTE — Progress Notes (Signed)
   03/07/15 1000  Clinical Encounter Type  Visited With Patient not available  Visit Type Follow-up  Consult/Referral To Chaplain  Attempted to follow up with patient, but patient was being wheeled out for procedure.  Will attempt to visit again later today.  [employer]bury Automotive GroupChaplain Helder Crisafulli-pager [phone]

## 2015-03-08 ENCOUNTER — Other Ambulatory Visit: Payer: Self-pay | Admitting: Ophthalmology

## 2015-03-08 ENCOUNTER — Encounter
Admission: EM | Disposition: A | Discharge status: Home / Self Care | Payer: Self-pay | Source: Home / Self Care | Attending: Internal Medicine

## 2015-03-08 LAB — URINE CULTURE: Culture: >=100000

## 2015-03-08 LAB — CBC
HEMATOCRIT: 26 % — AB (ref 35.0–47.0)
HEMOGLOBIN: 8.5 g/dL — AB (ref 12.0–16.0)
MCH: 27.6 pg (ref 26.0–34.0)
MCHC: 32.7 g/dL (ref 32.0–36.0)
MCV: 84.2 fL (ref 80.0–100.0)
Platelets: 173 10*3/uL (ref 150–440)
RBC: 3.09 MIL/uL — AB (ref 3.80–5.20)
RDW: 16.2 % — ABNORMAL HIGH (ref 11.5–14.5)
WBC: 3.2 10*3/uL — AB (ref 3.6–11.0)

## 2015-03-08 LAB — BASIC METABOLIC PANEL
Anion gap: 3 — ABNORMAL LOW (ref 5–15)
BUN: <5 mg/dL — ABNORMAL LOW (ref 6–20)
CHLORIDE: 109 mmol/L (ref 101–111)
CO2: 21 mmol/L — ABNORMAL LOW (ref 22–32)
Calcium: 7.4 mg/dL — ABNORMAL LOW (ref 8.9–10.3)
Creatinine, Ser: 0.3 mg/dL — ABNORMAL LOW (ref 0.44–1.00)
GFR calc Af Amer: >60 mL/min (ref >60)
GFR calc non Af Amer: >60 mL/min (ref >60)
Glucose, Bld: 115 mg/dL — ABNORMAL HIGH (ref 65–99)
POTASSIUM: 3.7 mmol/L (ref 3.5–5.1)
SODIUM: 133 mmol/L — AB (ref 135–145)

## 2015-03-08 LAB — TYPE AND SCREEN
ABO/RH(D): B POS
Antibody Screen: NEGATIVE
UNIT DIVISION: 0

## 2015-03-08 LAB — MAGNESIUM: Magnesium: 1.9 mg/dL (ref 1.7–2.4)

## 2015-03-08 SURGERY — ESOPHAGOGASTRODUODENOSCOPY (EGD) WITH PROPOFOL
Anesthesia: General

## 2015-03-08 MED ORDER — GANCICLOVIR 0.15 % OP GEL: 1.0000 "application " | Freq: Every day | OPHTHALMIC | Status: AC

## 2015-03-08 MED ORDER — GANCICLOVIR 0.15 % OP GEL
1.0000 [drp] | Freq: Every day | OPHTHALMIC | Status: DC
  Filled 2015-03-08: qty 5

## 2015-03-08 MED ORDER — SODIUM CHLORIDE 0.9 % IV SOLN
2.5000 mg/kg | Freq: Two times a day (BID) | INTRAVENOUS | Status: DC
  Administered 2015-03-09 – 2015-03-11 (×6): 65 mg via INTRAVENOUS
  Filled 2015-03-08 (×8): qty 65

## 2015-03-08 MED ORDER — GANCICLOVIR 0.15 % OP GEL
1.0000 [drp] | OPHTHALMIC | Status: DC
  Administered 2015-03-09 – 2015-03-12 (×20): 1 [drp] via OPHTHALMIC
  Filled 2015-03-08: qty 5

## 2015-03-08 MED ORDER — DEXTROSE 5 % IV SOLN
2.0000 g | INTRAVENOUS | Status: DC
  Administered 2015-03-08 – 2015-03-10 (×3): 2 g via INTRAVENOUS
  Filled 2015-03-08 (×4): qty 2

## 2015-03-08 NOTE — Progress Notes (Signed)
ANTIBIOTIC CONSULT NOTE -FOLLOW UP   Pharmacy Consult for Vancomycin/Ceftazidime Indication: rule out sepsis  Allergies  Allergen Reactions  . Penicillins Hives  . Sulfa Antibiotics Nausea Only and Rash    Rash and nausea. No breathing issues    Patient Measurements: Height: 5' (152.4 cm) Weight: 56 lb (25.401 kg) IBW/kg (Calculated) : 45.5   Vital Signs: Temp: 102.1 F (38.9 C) (11/11 0628) Temp Source: Axillary (11/11 0628) BP: 100/61 mmHg (11/11 0628) Pulse Rate: 108 (11/11 0628) Intake/Output from previous day: 11/10 0701 - 11/11 0700 In: 1785.3 [I.V.:1332.3; Blood:303; IV Piggyback:150] Out: 2075 [Urine:2075] Intake/Output from this shift:    Labs:  Recent Labs  03/06/15 0452 03/07/15 0630 03/08/15 0516  WBC  --  3.4* 3.2*  HGB  --  6.7* 8.5*  PLT  --  204 173  CREATININE 0.46 0.42* 0.30*   Estimated Creatinine Clearance: 35.2 mL/min (by C-G formula based on Cr of 0.3). No results for input(s): VANCOTROUGH, VANCOPEAK, VANCORANDOM, GENTTROUGH, GENTPEAK, GENTRANDOM, TOBRATROUGH, TOBRAPEAK, TOBRARND, AMIKACINPEAK, AMIKACINTROU, AMIKACIN in the last 72 hours.   Microbiology: Recent Results (from the past 720 hour(s))  Culture, blood (routine x 2)     Status: None (Preliminary result)   Collection Time: 03/06/15 11:14 AM  Result Value Ref Range Status   Specimen Description BLOOD RIGHT ARM  Final   Special Requests   Final    BOTTLES DRAWN AEROBIC AND ANAEROBIC  4CC AERO 5CC ANAERO   Culture NO GROWTH 2 DAYS  Final   Report Status PENDING  Incomplete  Culture, blood (routine x 2)     Status: None (Preliminary result)   Collection Time: 03/06/15 11:22 AM  Result Value Ref Range Status   Specimen Description BLOOD LEFT HAND  Final   Special Requests   Final    BOTTLES DRAWN AEROBIC AND ANAEROBIC 8CC AEROBIC,4CC ANAEROBIC   Culture NO GROWTH 2 DAYS  Final   Report Status PENDING  Incomplete  Urine culture     Status: None (Preliminary result)   Collection Time: 03/06/15 12:24 PM  Result Value Ref Range Status   Specimen Description URINE, CATHETERIZED  Final   Special Requests NONE  Final   Culture   Final    >=100,000 COLONIES/mL GRAM NEGATIVE RODS IDENTIFICATION AND SUSCEPTIBILITIES TO FOLLOW    Report Status PENDING  Incomplete    Medical History: Past Medical History  Diagnosis Date  . HIV (human immunodeficiency virus infection) (HCC)   . Anemia     Iron Deficient  . Osteomyelitis (HCC) Bilateral Femoral  . Parotid cyst   . Pneumonia   . Pulmonary hypertension (HCC)   . Selective deficiency of IgG subclasses (HCC)   . Septic arthritis (HCC)   . Sickle cell trait (HCC)   . Shortness of breath dyspnea     Medications:  Scheduled:  . antiseptic oral rinse  7 mL Mouth Rinse q12n4p  . azithromycin  600 mg Oral Weekly  . cefTAZidime (FORTAZ)  IV  1 g Intravenous Q12H  . collagenase   Topical Daily  . dapsone  100 mg Oral Daily  . darunavir  800 mg Oral Q breakfast  . elvitegravir-cobicistat-emtricitabine-tenofovir  1 tablet Oral Q breakfast  . fluconazole  200 mg Oral Daily  . heparin  5,000 Units Subcutaneous 3 times per day  . sodium chloride  1 g Oral TID WC  . vancomycin  500 mg Intravenous Q36H   Assessment: Pharmacy consulted to dose Vancomycin and Ceftazidime in a 44 yo  female with history of HIV.   Blood cultures ordered and empiric antibiotics desired for sepsis.   SCr: 0.46 (0.8), est CrCl~35.2 mL/min, ke: 0.034, t1/2: 20.38  Goal of Therapy:  Vancomycin trough level 15-20 mcg/ml  Plan:  Will continue Vancomycin 500 mg IV q36 hours  start maintenance dosing of 500 mg IV q36h.  Will check trough prior to dose on 11/13 at 0730 (close to steady state).    Will continue patient on Ceftazidime 1 gm IV q12h based on renal function.  Pharmacy will continue to follow.  Kyzer Blowe D 03/08/2015,9:43 AM

## 2015-03-08 NOTE — Plan of Care (Addendum)
Problem: Skin Integrity: Goal: Risk for impaired skin integrity will decrease Outcome: Not Progressing Chest tube removed yesterday without difficulty, VSS, patient tolerating well, resting in bed daughter at bedside, remains tachy on monitor.  Had a unit of blood yesterday, Temp 102 this am covered with tylenol

## 2015-03-08 NOTE — Progress Notes (Signed)
[facility] CLINIC INFECTIOUS DISEASE PROGRESS NOTE Date of Admission:  03/01/2015     ID: Donna Hayden is a 44 y.o. female with HIV/AIDS, traumatic PTX  Principal Problem:   Pneumothorax on right Active Problems:   Protein-calorie malnutrition, severe   Pressure ulcer   Subjective: Still febrile, min cough, swallowing fine now. Has L eye swelling, redness and loss of vision. No abd pain, diarrhea.    ROS  Eleven systems are reviewed and negative except per hpi  Medications:  Antibiotics Given (last 72 hours)    Date/Time Action Medication Dose Rate   03/06/15 1249 Given   cefTAZidime (FORTAZ) 1 g in dextrose 5 % 50 mL IVPB 1 g 100 mL/hr   03/06/15 1534 Given  [Bolus given.]   vancomycin (VANCOCIN) 500 mg in sodium chloride 0.9 % 100 mL IVPB 500 mg 100 mL/hr   03/06/15 2236 Given   cefTAZidime (FORTAZ) 1 g in dextrose 5 % 50 mL IVPB 1 g 100 mL/hr   03/07/15 1142 Given   vancomycin (VANCOCIN) 500 mg in sodium chloride 0.9 % 100 mL IVPB 500 mg 100 mL/hr   03/07/15 1248 Given   cefTAZidime (FORTAZ) 1 g in dextrose 5 % 50 mL IVPB 1 g 100 mL/hr   03/07/15 2156 Given   cefTAZidime (FORTAZ) 1 g in dextrose 5 % 50 mL IVPB 1 g 100 mL/hr   03/08/15 1024 Given   cefTAZidime (FORTAZ) 1 g in dextrose 5 % 50 mL IVPB 1 g 100 mL/hr     . antiseptic oral rinse  7 mL Mouth Rinse q12n4p  . azithromycin  600 mg Oral Weekly  . cefTAZidime (FORTAZ)  IV  1 g Intravenous Q12H  . collagenase   Topical Daily  . dapsone  100 mg Oral Daily  . darunavir  800 mg Oral Q breakfast  . elvitegravir-cobicistat-emtricitabine-tenofovir  1 tablet Oral Q breakfast  . fluconazole  200 mg Oral Daily  . heparin  5,000 Units Subcutaneous 3 times per day  . sodium chloride  1 g Oral TID WC  . vancomycin  500 mg Intravenous Q36H   Objective: Vital signs in last 24 hours: Temp:  [97.7 F (36.5 C)-102.1 F (38.9 C)] 102.1 F (38.9 C) (11/11 0628) Pulse Rate:  [90-108] 90 (11/11 1314) Resp:  [20-30] 20  (11/11 1314) BP: (98-105)/(50-64) 105/64 mmHg (11/11 1314) SpO2:  [98 %-100 %] 99 % (11/11 1314) Constitutional: Cachectic HENT: Donna Hayden/AT, L eye exophthalmos, injected, makred decreased vision L eye  To finger counting,  no scleral icterus Mouth/Throat: Oropharynx is clear and moist. No oropharyngeal exudate.  Cardiovascular: Normal rate, regular rhythm and normal heart sounds. Pulmonary/Chest: midl rhonchi bil, good air movement Neck = supple, no nuchal rigidity Abdominal: Soft. Bowel sounds are normal. exhibits no distension. There is no tenderness.  Lymphadenopathy: no cervical adenopathy. No axillary adenopathy Neurological: alert and oriented to person, place, and time.  Skin: Skin is warm and dry. No rash noted. No erythema.  Psychiatric: flat affect   Lab Results  Recent Labs  03/07/15 0630 03/08/15 0516  WBC 3.4* 3.2*  HGB 6.7* 8.5*  HCT 21.0* 26.0*  NA 126* 133*  K 2.8* 3.7  CL 102 109  CO2 20* 21*  BUN <5* <5*  CREATININE 0.42* 0.30*   Studies/Results: Ct Chest Wo Contrast  03/07/2015  CLINICAL DATA:  Pneumothorax. Left lower lobe indistinct opacities on chest radiography. Right pleural drainage tube. HIV. Sickle cell trait. Pneumonia. EXAM: CT CHEST WITHOUT CONTRAST TECHNIQUE: Multidetector CT  imaging of the chest was performed following the standard protocol without IV contrast. COMPARISON:  03/06/2015 FINDINGS: Mediastinum/Nodes: Cachexia. Poor definition of fat planes, possible third spacing of fluid contributing. Low-density blood pool compatible with anemia. No definite adenopathy but reduced negative predictive value for mild to moderate adenopathy due to the ill definition of fat planes. Lungs/Pleura: Right pleural drainage catheter in place. No residual pneumothorax. Airspace opacities in both lower lobes, left greater than right, especially peripherally, with some surrounding patchy ground-glass opacities which are ill-defined. Upper abdomen: Poor definition of  fat planes. Musculoskeletal: Unremarkable IMPRESSION: 1. Bibasilar airspace opacities, left greater than right, suspicious for pneumonia. 2. No residual pneumothorax.  Right pleural drain in place. 3. Cachexia. Severe ill definition of fat planes potentially from third spacing of fluid. Electronically Signed   By: Gaylyn Rong M.D.   On: 03/07/2015 11:05   Dg Chest Port 1 View  03/07/2015  CLINICAL DATA:  44 year old admitted 03/01/2015 with a spontaneous right pneumothorax. Pleural drainage catheter removed earlier today. EXAM: PORTABLE CHEST 1 VIEW COMPARISON:  CT chest earlier today. Chest x-rays 03/06/2015 and earlier. FINDINGS: Interval right chest tube removal. No evidence of pneumothorax. Emphysematous changes throughout both lungs, hyperinflation, prominent bronchovascular markings diffusely, and patchy airspace opacities in the lung bases (left greater than right), unchanged. No new pulmonary parenchymal abnormalities. IMPRESSION: 1. No pneumothorax after right chest tube removal. 2. Stable atelectasis and/or pneumonia in the lower lobes, left greater than right, superimposed upon COPD/emphysema. Electronically Signed   By: Hulan Saas M.D.   On: 03/07/2015 15:06    Assessment/Plan:  Donna Hayden is a 44 y.o. female with advanced AIDS CD 4 ct 29 in Sept 2016, off meds, previously followed at [facility] who was admitted with traumatic ptx after a fall. She has had odynophagia and has had work up as otpt but no showed for multiple EGD attempts.  She has bene treated for thrush and is also on azithromycin weekly and started dapsone recently for PCP PX (Bactrim allergy, nml G6PD activity). She has multiple other medical issues, is quite frail, losing wt, largely immobile.  I reviewed her notes from [facility] and her prior labs.  She has not had an undetectable viral load in many years and her CD4 ct has continued to decline. I think she has a long history of non compliance as the most likely  cause. Currently has  fevers, UA +, UCX E coli. Has new vision loss L eye injection. 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 But she was confused about it and was refusing it. I had long talk with her today and she has agreed to take all the medicines we prescribe.  She will restart.  Monitor for IRIS as very high risk  PTX and infiltrate - Cont ceftraixone   Prophylaxis Cont weely azithro (for  MAI),dapsone (for PCP)  Dysphagia- responded to prolonged fluc so likely candidal esophagitis. Can postpone EGD to otpt  I spoke extensively with pt today. Advised her she has < 6 months to live likely but if she takes her ART she may have recovery. I advised her to accept home hospice and cooperate with all recs. She has agreed. I also suggested she disclose her status  to her 377 year old daughter. Patient does not want to do it today since many family members are here. I gave the patient my cell phone # and asked her to call me tomorrow and I can help disclose to her daughter.  Thank you very much for the consult. Will follow with you.  Tabari Volkert   03/08/2015, 2:41 PM

## 2015-03-08 NOTE — Progress Notes (Signed)
Reason for Consult:loss of vision OS Referring Physician: [name] is an 44 y.o. female.  Chief complaint: loss of vision OS x 1-2 weeks.  HPI: Pt is HIV pos with advanced AIDS who presents with loss of vision L eye x 1-2 weeks.  Denies any prior visual probs.   Also c/o tearing and discomfort OS x 3 days. Mild redness and photosens.  Past Medical History  Diagnosis Date  . HIV (human immunodeficiency virus infection) ([place])   . Anemia     Iron Deficient  . Osteomyelitis (HCC) Bilateral Femoral  . Parotid cyst   . Pneumonia   . Pulmonary hypertension ([place])   . 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)   . Septic arthritis ([place])   . Sickle cell trait ([place])   . Shortness of breath dyspnea     ROS  Past Surgical History  Procedure Laterality Date  . No past surgeries    . Shoulder arthroscopy      Family History  Problem Relation Age of Onset  . Diabetes Mother   . Coarctation of the aorta Other     Social History:  reports that she has never smoked. She has never used smokeless tobacco. She reports that she does not drink alcohol or use illicit drugs.  Allergies:  Allergies  Allergen Reactions  . Penicillins Hives  . Sulfa Antibiotics Nausea Only and Rash    Rash and nausea. No breathing issues    Medications: I have reviewed the patient's current medications.  Results for orders placed or performed during the hospital encounter of 03/01/15 (from the past 48 hour(s))  Magnesium     Status: Abnormal   Collection Time: 03/07/15  6:30 AM  Result Value Ref Range   Magnesium 1.5 (L) 1.7 - 2.4 mg/dL  Basic metabolic panel     Status: Abnormal   Collection Time: 03/07/15  6:30 AM  Result Value Ref Range   Sodium 126 (L) 135 - 145 mmol/L   Potassium 2.8 (LL) 3.5 - 5.1 mmol/L    Comment: CRITICAL RESULT CALLED TO, READ BACK BY AND VERIFIED WITH Donna Hayden @ 0720 03/07/15 TCH    Chloride 102 101 - 111 mmol/L   CO2 20 (L) 22 - 32 mmol/L    Glucose, Bld 124 (H) 65 - 99 mg/dL   BUN <5 (L) 6 - 20 mg/dL   Creatinine, Ser 0.42 (L) 0.44 - 1.00 mg/dL   Calcium 6.8 (L) 8.9 - 10.3 mg/dL   GFR calc non Af Amer >60 >60 mL/min   GFR calc Af Amer >60 >60 mL/min    Comment: (NOTE) The eGFR has been calculated using the CKD EPI equation. This calculation has not been validated in all clinical situations. eGFR's persistently <60 mL/min signify possible Chronic Kidney Disease.    Anion gap 4 (L) 5 - 15  CBC     Status: Abnormal   Collection Time: 03/07/15  6:30 AM  Result Value Ref Range   WBC 3.4 (L) 3.6 - 11.0 K/uL   RBC 2.51 (L) 3.80 - 5.20 MIL/uL   Hemoglobin 6.7 (L) 12.0 - 16.0 g/dL   HCT 21.0 (L) 35.0 - 47.0 %   MCV 83.6 80.0 - 100.0 fL   MCH 26.9 26.0 - 34.0 pg   MCHC 32.1 32.0 - 36.0 g/dL   RDW 18.0 (H) 11.5 - 14.5 %   Platelets 204 150 - 440 K/uL  Type and screen [place]  Status: None   Collection Time: 03/07/15  8:13 AM  Result Value Ref Range   ABO/RH(D) B POS    Antibody Screen NEG    Sample Expiration 03/10/2015    Unit Number [id number]    Blood Component Type RED CELLS,LR    Unit division 00    Status of Unit ISSUED,FINAL    Transfusion Status OK TO TRANSFUSE    Crossmatch Result Compatible   ABO/Rh     Status: None   Collection Time: 03/07/15  8:14 AM  Result Value Ref Range   ABO/RH(D) B POS   Prepare RBC     Status: None   Collection Time: 03/07/15  8:17 AM  Result Value Ref Range   Order Confirmation ORDER PROCESSED BY BLOOD BANK   Basic metabolic panel     Status: Abnormal   Collection Time: 03/08/15  5:16 AM  Result Value Ref Range   Sodium 133 (L) 135 - 145 mmol/L   Potassium 3.7 3.5 - 5.1 mmol/L   Chloride 109 101 - 111 mmol/L   CO2 21 (L) 22 - 32 mmol/L   Glucose, Bld 115 (H) 65 - 99 mg/dL   BUN <5 (L) 6 - 20 mg/dL   Creatinine, Ser 0.30 (L) 0.44 - 1.00 mg/dL   Calcium 7.4 (L) 8.9 - 10.3 mg/dL   GFR calc non Af Amer >60 >60 mL/min   GFR calc Af Amer >60 >60  mL/min    Comment: (NOTE) The eGFR has been calculated using the CKD EPI equation. This calculation has not been validated in all clinical situations. eGFR's persistently <60 mL/min signify possible Chronic Kidney Disease.    Anion gap 3 (L) 5 - 15  CBC     Status: Abnormal   Collection Time: 03/08/15  5:16 AM  Result Value Ref Range   WBC 3.2 (L) 3.6 - 11.0 K/uL   RBC 3.09 (L) 3.80 - 5.20 MIL/uL   Hemoglobin 8.5 (L) 12.0 - 16.0 g/dL   HCT 26.0 (L) 35.0 - 47.0 %   MCV 84.2 80.0 - 100.0 fL   MCH 27.6 26.0 - 34.0 pg   MCHC 32.7 32.0 - 36.0 g/dL   RDW 16.2 (H) 11.5 - 14.5 %   Platelets 173 150 - 440 K/uL  Magnesium     Status: None   Collection Time: 03/08/15  5:16 AM  Result Value Ref Range   Magnesium 1.9 1.7 - 2.4 mg/dL    Ct Chest Wo Contrast  03/07/2015  CLINICAL DATA:  Pneumothorax. Left lower lobe indistinct opacities on chest radiography. Right pleural drainage tube. HIV. Sickle cell trait. Pneumonia. EXAM: CT CHEST WITHOUT CONTRAST TECHNIQUE: Multidetector CT imaging of the chest was performed following the standard protocol without IV contrast. COMPARISON:  03/06/2015 FINDINGS: Mediastinum/Nodes: Cachexia. Poor definition of fat planes, possible third spacing of fluid contributing. Low-density blood pool compatible with anemia. No definite adenopathy but reduced negative predictive value for mild to moderate adenopathy due to the ill definition of fat planes. Lungs/Pleura: Right pleural drainage catheter in place. No residual pneumothorax. Airspace opacities in both lower lobes, left greater than right, especially peripherally, with some surrounding patchy ground-glass opacities which are ill-defined. Upper abdomen: Poor definition of fat planes. Musculoskeletal: Unremarkable IMPRESSION: 1. Bibasilar airspace opacities, left greater than right, suspicious for pneumonia. 2. No residual pneumothorax.  Right pleural drain in place. 3. Cachexia. Severe ill definition of fat planes  potentially from third spacing of fluid. Electronically Signed   By: Donna Hayden  M.D.   On: 03/07/2015 11:05   Dg Chest Port 1 View  03/07/2015  CLINICAL DATA:  44 year old admitted 03/01/2015 with a spontaneous right pneumothorax. Pleural drainage catheter removed earlier today. EXAM: PORTABLE CHEST 1 VIEW COMPARISON:  CT chest earlier today. Chest x-rays 03/06/2015 and earlier. FINDINGS: Interval right chest tube removal. No evidence of pneumothorax. Emphysematous changes throughout both lungs, hyperinflation, prominent bronchovascular markings diffusely, and patchy airspace opacities in the lung bases (left greater than right), unchanged. No new pulmonary parenchymal abnormalities. IMPRESSION: 1. No pneumothorax after right chest tube removal. 2. Stable atelectasis and/or pneumonia in the lower lobes, left greater than right, superimposed upon COPD/emphysema. Electronically Signed   By: Evangeline Dakin M.D.   On: 03/07/2015 15:06    There were no vitals taken for this visit.  Mental status: Alert and Oriented x 4  Visual Acuity:  20/40 OD  20/cf near [place]  Pupils:  rl OD.  Poorly RL OS - +RAPD OS.  Motility:  Full/ orthophoric  Visual Fields:  Full to confrontation OD.  Loss of nasal VF OS  IOP:  7 OD/ 5 OS tonopen  External/ Lids/ Lashes:  Normal  Anterior Segment:  Conjunctiva:  Normal  OD 1+ inj OS   Cornea:  Normal  OD  Irreg epith inferiorly OS - dendritic/ pseudodendritic appearance.  Hazy cornea  Anterior Chamber: Normal  OU  Lens:   Normal OU  Posterior Segment: Dilated OU with 1% Tropicamide and 2.5% Phenylephrine  Hazy view through cornea and vitreous OS  Discs:   Normal c/d ratio, no pallor, no edema OU  Macula:  Normal  Vessels/ Periphery: Normal OD      OS:   White retinal edema/ infiltrates supero-nasal to disc with hemorrhage peripherally.        Inferior hemorrhage and retinal whitening/ infiltrates.    Assessment/Plan: CMV Retinitis OS - discussed w  Dr Ola Spurr re . Start of IV Gancyclovir.  Will also disc w [facility] re possibility of furhter treatment (intravitreal gancyclovir) Keratitis - will start topical gancyclovir (Zirgan ) 5 times daily  Donna Hayden 03/08/2015, 6:30 PM

## 2015-03-08 NOTE — Progress Notes (Signed)
Nutrition Follow-up  DOCUMENTATION CODES:   Severe malnutrition in context of chronic illness  INTERVENTION:   Meals and Snacks: Cater to patient preferences. RD took dinner order of cheeseburger with chips, ice cream and pepsi. Will add 20oz. Pepsi in a bottle to all meal trays per request. Pt reports liking her afternoon milkshakes. Encourage po intake at all meal times with assistance as needed. CNA agreeable to checking in at meals times to offer encouragement and assistance as needed.  Medical Food Supplement Therapy: continue as ordered Coordination of Care: RD reviewed MD notes, will follow poc and make recommendations accordingly.    NUTRITION DIAGNOSIS:   Malnutrition related to chronic illness as evidenced by severe depletion of body fat, severe depletion of muscle mass.  GOAL:   Patient will meet greater than or equal to 90% of their needs  MONITOR:    (Energy intake, Anthropometric)  REASON FOR ASSESSMENT:   Malnutrition Screening Tool, Consult Assessment of nutrition requirement/status  ASSESSMENT:    Per MD note, no EGD secondary to pt status as well as improved odynophagia likely candidal esophagitis per MD note. Chest tube successfully removed per surgical MD note.   Diet Order:  Diet regular Room service appropriate?: Yes; Fluid consistency:: Thin    Current Nutrition: Pt ate bites of lunch today but reports pasta was not soft enough and meat tasted bland. Pt refused dinner last night per documentation and ate 60% of dinner on 11/9 and 100% on 11/8, otherwise minimal intake. CNA reports daughter has been helping pt to eat her meals. RD notes per hospice care nurse pt has been too weak to hold a cup.   Gastrointestinal Profile: Last BM: 03/07/2015   Scheduled Medications:  . antiseptic oral rinse  7 mL Mouth Rinse q12n4p  . azithromycin  600 mg Oral Weekly  . cefTRIAXone (ROCEPHIN)  IV  2 g Intravenous Q24H  . collagenase   Topical Daily  . dapsone   100 mg Oral Daily  . darunavir  800 mg Oral Q breakfast  . elvitegravir-cobicistat-emtricitabine-tenofovir  1 tablet Oral Q breakfast  . fluconazole  200 mg Oral Daily  . heparin  5,000 Units Subcutaneous 3 times per day  . sodium chloride  1 g Oral TID WC    Continuous Medications:  . dextrose 5 % and 0.9% NaCl 125 mL/hr at 03/08/15 1024     Electrolyte/Renal Profile and Glucose Profile:   Recent Labs Lab 03/06/15 0452 03/07/15 0630 03/08/15 0516  NA 130* 126* 133*  K 3.6 2.8* 3.7  CL 106 102 109  CO2 20* 20* 21*  BUN 7 <5* <5*  CREATININE 0.46 0.42* 0.30*  CALCIUM 7.4* 6.8* 7.4*  MG 1.5* 1.5* 1.9  GLUCOSE 91 124* 115*   Protein Profile:   Recent Labs Lab 03/01/15 2110  ALBUMIN 1.9*     Weight Trend since Admission: Filed Weights   03/01/15 1941  Weight: 56 lb (25.401 kg)    Skin:   (Stage III coccyx pressure ulcer)   BMI:  Body mass index is 10.94 kg/(m^2).   Estimated Nutritional Needs:   Kcal:  BEE 1011 kcals (IF 1.0-1.2, AF 1.2) [id number] kcals/d.  Using IBW of 45kg  Protein:  (1.2-1.5 g/kg) 54-68 g/d Usign IbW of 45kg  Fluid:  (25-3430ml/kg) 1125-136850ml/d   EDUCATION NEEDS:   No education needs identified at this time   MODERATE Care Level  Leda QuailAllyson Aksh Swart, RD, LDN Pager [phone]

## 2015-03-08 NOTE — Progress Notes (Signed)
ID fu Spoke with Dr Sharman CrateBrassington who confirmed dx of CMV retinitis I have ordered IV ganciclovir for now. [facility] intra vitreal ganciclovir.

## 2015-03-08 NOTE — Progress Notes (Addendum)
New referral for [facility] at home after discharge received from [facility]. Donna Hayden is a 44 year old woman admitted to [facility] on 11/4 after a fall at home. She was found to have a right pneumothorax and a chest tube was placed in the ED. She has a history of advanced HIV, pulmonary hypertension, pneumonia, sickle cell trait, septic arthritis and osteomyelitis. She lives at home with her 62 year old daughter who is her primary caregiver. At this time per report of consulting physician Dr. Ola Spurr, patient has not disclosed her HIV status to any of her family, including her daughter. Writer met with Donna Hayden and [name] in the patient's room to initiate education regarding hospice services, philosophy and team approach to care with understanding voiced. Patient's daughter was present but was difficult to include in the conversation, not making any eye contact. She did report that she has been carrying her mother from her bedroom upstairs to the couch daily for the last 1-2 months. Patient per her own report has not walked in "weeks". She appears very cachectic and weak, unable to hold a cup or wipe her eye with a wash cloth when it watered. Weight is 56 lbs, albumin 1.9. Chest tube removed yesterday, she denied pain, is currently on 2 liters of oxygen, down from 4 earlier in the week, with the hopes of not needing it when she goes home. She declined a hospital bed, stating there was not room for it, she was planning on staying on the couch. She is currently on a pressure reducing mattress in the hospital due to an unstageable sacral wound.  She did request a rollator walker, Probation officer discussed the need for PT consult to recommend this equipment, she has so far been unable to work with physical therapy due to her weakness. Writer also confirmed patient's desire to remain a Full Code. She stated "i've been fighting this a long time and will continue to fight it". RNCM  Hassan Rowan updated to writer's conversation. No plans for discharge at this time. Will continue to follow through final disposition. Hospice folder and contact information left in the patient's room. Thank you. Flo Shanks RN, BSN, [facility] [place], [facility] [phone]

## 2015-03-08 NOTE — Progress Notes (Signed)
[facility] [place] at [facility]   PATIENT NAME: Pricilla LarssonStephanie Wholey    MR#:  [id number]  DATE OF BIRTH:  01/03/1971  SUBJECTIVE:  CHIEF COMPLAINT:   Chief Complaint  Patient presents with  . Shoulder Injury   no complaint. on [place] 3L. fever 102 this morning. On oxygen [place] 4 L.  REVIEW OF SYSTEMS:  CONSTITUTIONAL: Has fever and weakness.  EYES: No blurred or double vision.  EARS, NOSE, AND THROAT: No tinnitus or ear pain.  RESPIRATORY: No cough, shortness of breath, wheezing or hemoptysis.  CARDIOVASCULAR: No chest pain, orthopnea, edema.  GASTROINTESTINAL: No nausea, vomiting, diarrhea or abdominal pain.  GENITOURINARY: No dysuria, hematuria.  ENDOCRINE: No polyuria, nocturia,  HEMATOLOGY: No anemia, easy bruising or bleeding SKIN: No rash or lesion. MUSCULOSKELETAL: No joint pain or arthritis. NEUROLOGIC: No tingling, numbness, weakness.  PSYCHIATRY: No anxiety or depression.   DRUG ALLERGIES:   Allergies  Allergen Reactions  . Penicillins Hives  . Sulfa Antibiotics Nausea Only and Rash    Rash and nausea. No breathing issues    VITALS:  Blood pressure 105/64, pulse 90, temperature 102.1 F (38.9 C), temperature source Axillary, resp. rate 20, height 5' (1.524 m), weight 25.401 kg (56 lb), SpO2 99 %.  PHYSICAL EXAMINATION:  GENERAL:  44 y.o.-year-old patient lying in the bed with no acute distress. Chronically ill-appearing and malnutrition status. EYES: Pupils equal, round, reactive to light and accommodation. No scleral icterus. Extraocular muscles intact.  HEENT: Head atraumatic, normocephalic. Oropharynx and nasopharynx clear.  NECK:  Supple, no jugular venous distention. No thyroid enlargement, no tenderness.  LUNGS: Normal breath sounds bilaterally, no wheezing, but has crackles on left side. No use of accessory muscles of respiration. No air leak from chest tube suction. CARDIOVASCULAR: S1, S2 normal. No murmurs, rubs, or gallops.   ABDOMEN: Soft, nontender, nondistended. Bowel sounds present. No organomegaly or mass.  EXTREMITIES: No pedal edema, cyanosis, or clubbing.  NEUROLOGIC: Cranial nerves II through XII are intact. Muscle strength 4/5 in upper and 2/5 in lower extremities. Sensation intact. Gait not checked.  PSYCHIATRIC: The patient is alert and oriented x 3.  SKIN: Sacral DU stage III.   LABORATORY PANEL:   CBC  Recent Labs Lab 03/08/15 0516  WBC 3.2*  HGB 8.5*  HCT 26.0*  PLT 173   ------------------------------------------------------------------------------------------------------------------  Chemistries   Recent Labs Lab 03/01/15 2110  03/08/15 0516  NA 132*  < > 133*  K 3.3*  < > 3.7  CL 103  < > 109  CO2 26  < > 21*  GLUCOSE 91  < > 115*  BUN 13  < > <5*  CREATININE 0.61  < > 0.30*  CALCIUM 8.3*  < > 7.4*  MG  --   < > 1.9  AST 29  --   --   ALT 7*  --   --   ALKPHOS 36*  --   --   BILITOT 0.7  --   --   < > = values in this interval not displayed. ------------------------------------------------------------------------------------------------------------------  Cardiac Enzymes No results for input(s): TROPONINI in the last 168 hours. ------------------------------------------------------------------------------------------------------------------  RADIOLOGY:  Ct Chest Wo Contrast  03/07/2015  CLINICAL DATA:  Pneumothorax. Left lower lobe indistinct opacities on chest radiography. Right pleural drainage tube. HIV. Sickle cell trait. Pneumonia. EXAM: CT CHEST WITHOUT CONTRAST TECHNIQUE: Multidetector CT imaging of the chest was performed following the standard protocol without IV contrast. COMPARISON:  03/06/2015 FINDINGS: Mediastinum/Nodes: Cachexia. Poor definition  of fat planes, possible third spacing of fluid contributing. Low-density blood pool compatible with anemia. No definite adenopathy but reduced negative predictive value for mild to moderate adenopathy due to the  ill definition of fat planes. Lungs/Pleura: Right pleural drainage catheter in place. No residual pneumothorax. Airspace opacities in both lower lobes, left greater than right, especially peripherally, with some surrounding patchy ground-glass opacities which are ill-defined. Upper abdomen: Poor definition of fat planes. Musculoskeletal: Unremarkable IMPRESSION: 1. Bibasilar airspace opacities, left greater than right, suspicious for pneumonia. 2. No residual pneumothorax.  Right pleural drain in place. 3. Cachexia. Severe ill definition of fat planes potentially from third spacing of fluid. Electronically Signed   By: Gaylyn Rong M.D.   On: 03/07/2015 11:05   Dg Chest Port 1 View  03/07/2015  CLINICAL DATA:  44 year old admitted 03/01/2015 with a spontaneous right pneumothorax. Pleural drainage catheter removed earlier today. EXAM: PORTABLE CHEST 1 VIEW COMPARISON:  CT chest earlier today. Chest x-rays 03/06/2015 and earlier. FINDINGS: Interval right chest tube removal. No evidence of pneumothorax. Emphysematous changes throughout both lungs, hyperinflation, prominent bronchovascular markings diffusely, and patchy airspace opacities in the lung bases (left greater than right), unchanged. No new pulmonary parenchymal abnormalities. IMPRESSION: 1. No pneumothorax after right chest tube removal. 2. Stable atelectasis and/or pneumonia in the lower lobes, left greater than right, superimposed upon COPD/emphysema. Electronically Signed   By: Hulan Saas M.D.   On: 03/07/2015 15:06    EKG:  No orders found for this or any previous visit.  ASSESSMENT AND PLAN:   1, Right-sided pneumothorax.  Removed chest tube by Dr. Sharmon Revere. Repeated chest x-ray: No pneumothorax  But chest CT show bilateral lower lobe opacity, left side is worse than right side.  2. Hyponatremia. Improving,  discontinued D5-normal saline IV, follow-up BMP. 3. Hypokalemia. Improved after IV and by mouth potassium  supplement. * Hypomagnesemia. Improved Giving IV magnesium.  4. 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 Started ART by Dr. Sampson Goon. CD 4 ct 29 in Sept 2016. CD3+CD4+cells/CD3+CD8+Cells: 0.14.  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 Sampson Goon.  *Severe malnutrition. Follow-up Dietitian. Need GI consult for odynophagia and weight loss per Dr. Sampson Goon. Per Dr. Shelle Iron, cannot get EGD due to hyponatremia and sepsis.  *Anemia of chronic disease. Hemoglobin decreased to 6.7 and up to 8.5 after 1 unit PRBC transfusion. Follow-up hemoglobin * Neutropenia. better, follow-up CBC.  *Septic shock with UTI and pneumonia (hypotension, tachycardia, leukopenia, AIDS). Hypotension improved but still low side,  tachycardia improved, discontinued D5-normal saline IV, discontinue vancomycin and ceftazidime, start Rocephin per  Dr. Sampson Goon. Follow-up blood culture (negative so far) and urine culture (E Coli).   *Hypoglycemia yesterday. Improved. Oral intake is better. *Sacral DU stage III. Wound care.  The patient needs hospice care at home.  Discussed with Dr. Sharmon Revere and Dr. Sampson Goon. All the records are reviewed and case discussed with Care Management/Social Workerr. Management plans discussed with the patient, her daughter and they are in agreement. Greater than 50% time was spent on coordination of care and face-to-face counseling.  CODE STATUS: Full code  TOTAL TIME TAKING CARE OF THIS PATIENT: 45 minutes.   POSSIBLE D/C IN ? DAYS, DEPENDING ON CLINICAL CONDITION.   Shaune Pollack M.D on 03/08/2015 at 2:47 PM  Between 7am to 6pm - Pager - [phone]  After 6pm go to [email or web address] - password  EPAS [facility]  [facility]  Office  [phone]  CC: Primary care physician; WOODS, Garnette Czech, MD

## 2015-03-08 NOTE — Care Management (Signed)
Updated personal [facility] services application faxed to Donna Hayden and Donna Hayden, [facility].  Chest Tube pulled yesterday. Donna Hayden is on 2 liters oxygen per nasal cannula. Temperature = 102.1 IV Vancomycin/Fortaz continues. Donna GreetBrenda S Eufelia Veno RN MSN CCM [facility]  [phone])

## 2015-03-09 LAB — CBC
HEMATOCRIT: 24.2 % — AB (ref 35.0–47.0)
Hemoglobin: 8.2 g/dL — ABNORMAL LOW (ref 12.0–16.0)
MCH: 28.3 pg (ref 26.0–34.0)
MCHC: 33.7 g/dL (ref 32.0–36.0)
MCV: 84.1 fL (ref 80.0–100.0)
PLATELETS: 165 10*3/uL (ref 150–440)
RBC: 2.88 MIL/uL — ABNORMAL LOW (ref 3.80–5.20)
RDW: 16.5 % — AB (ref 11.5–14.5)
WBC: 2.9 10*3/uL — ABNORMAL LOW (ref 3.6–11.0)

## 2015-03-09 NOTE — Progress Notes (Signed)
[facility] [place] at [facility]   PATIENT NAME: Donna LarssonStephanie Hayden    MR#:  [id number]  DATE OF BIRTH:  11-05-1970  SUBJECTIVE:  CHIEF COMPLAINT:   Chief Complaint  Patient presents with  . Shoulder Injury   no complaint, wants to go home, still has difficulty swallowing, especially larger pills. on [place] 2L. fever is subsiding . Initiated on ganciclovir for CMV retinitis intravenously .  REVIEW OF SYSTEMS:  CONSTITUTIONAL: Has fever and weakness.  EYES: No blurred or double vision.  EARS, NOSE, AND THROAT: No tinnitus or ear pain.  RESPIRATORY: No cough, shortness of breath, wheezing or hemoptysis.  CARDIOVASCULAR: No chest pain, orthopnea, edema.  GASTROINTESTINAL: No nausea, vomiting, diarrhea or abdominal pain.  GENITOURINARY: No dysuria, hematuria.  ENDOCRINE: No polyuria, nocturia,  HEMATOLOGY: No anemia, easy bruising or bleeding SKIN: No rash or lesion. MUSCULOSKELETAL: No joint pain or arthritis. NEUROLOGIC: No tingling, numbness, weakness.  PSYCHIATRY: No anxiety or depression.   DRUG ALLERGIES:   Allergies  Allergen Reactions  . Penicillins Hives  . Sulfa Antibiotics Nausea Only and Rash    Rash and nausea. No breathing issues    VITALS:  Blood pressure 108/72, pulse 97, temperature 98.7 F (37.1 C), temperature source Oral, resp. rate 18, height 5' (1.524 m), weight 25.401 kg (56 lb), SpO2 98 %.  PHYSICAL EXAMINATION:  GENERAL:  44 y.o.-year-old patient lying in the bed with no acute distress. Chronically ill-appearing and malnurished, wasted. EYES: Pupils equal, round, reactive to light and accommodation. No scleral icterus. Extraocular muscles intact.  HEENT: Head atraumatic, normocephalic. Oropharynx and nasopharynx clear.  NECK:  Supple, no jugular venous distention. No thyroid enlargement, no tenderness.  LUNGS: Normal breath sounds bilaterally, no wheezing, but has crackles on left side. No use of accessory muscles of  respiration. No air leak from chest tube suction. CARDIOVASCULAR: S1, S2 normal. No murmurs, rubs, or gallops.  ABDOMEN: Soft, nontender, nondistended. Bowel sounds present. No organomegaly or mass.  EXTREMITIES: No pedal edema, cyanosis, or clubbing.  NEUROLOGIC: Cranial nerves II through XII are intact. Muscle strength 4/5 in upper and 2/5 in lower extremities. Sensation intact. Gait not checked.  PSYCHIATRIC: The patient is alert and oriented x 3.  SKIN: Sacral DU stage III.   LABORATORY PANEL:   CBC  Recent Labs Lab 03/09/15 0450  WBC 2.9*  HGB 8.2*  HCT 24.2*  PLT 165   ------------------------------------------------------------------------------------------------------------------  Chemistries   Recent Labs Lab 03/08/15 0516  NA 133*  K 3.7  CL 109  CO2 21*  GLUCOSE 115*  BUN <5*  CREATININE 0.30*  CALCIUM 7.4*  MG 1.9   ------------------------------------------------------------------------------------------------------------------  Cardiac Enzymes No results for input(s): TROPONINI in the last 168 hours. ------------------------------------------------------------------------------------------------------------------  RADIOLOGY:  Dg Chest Port 1 View  03/07/2015  CLINICAL DATA:  44 year old admitted 03/01/2015 with a spontaneous right pneumothorax. Pleural drainage catheter removed earlier today. EXAM: PORTABLE CHEST 1 VIEW COMPARISON:  CT chest earlier today. Chest x-rays 03/06/2015 and earlier. FINDINGS: Interval right chest tube removal. No evidence of pneumothorax. Emphysematous changes throughout both lungs, hyperinflation, prominent bronchovascular markings diffusely, and patchy airspace opacities in the lung bases (left greater than right), unchanged. No new pulmonary parenchymal abnormalities. IMPRESSION: 1. No pneumothorax after right chest tube removal. 2. Stable atelectasis and/or pneumonia in the lower lobes, left greater than right, superimposed  upon COPD/emphysema. Electronically Signed   By: Hulan Saashomas  Lawrence M.D.   On: 03/07/2015 15:06    EKG:  No orders found for  this or any previous visit.  ASSESSMENT AND PLAN:   1, Right-sided  traumatic pneumothorax.  Removed chest tube by Dr. Sharmon Revere. Repeated chest x-ray: No pneumothorax  But chest CT show bilateral lower lobe opacity, left side is worse than right side.  2. Hyponatremia. Improving,  discontinued D5-normal saline IV, follow-up BMP Exam. 3. Hypokalemia.Resolvedr IV and by mouth potassium supplement. * Hypomagnesemia. Improved Giving IV magnesium.  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 Started ART by Dr. Sampson Goon. CD 4 ct 29 in Sept 2016. 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 Appreciate  Dr Karalee Height input  Check CMV PCR on serum. No on  ganciclovir  intravenously per Dr. Sampson Goon.  *Severe malnutrition. Follow-up Dietitian. Need GI consult for odynophagia and weight loss per Dr. Sampson Goon. Per Dr. Shelle Iron, cannot get EGD due to hyponatremia and sepsis.  *Anemia of chronic disease. Hemoglobin decreased to 6.7 and up to 8.5 after 1 unit PRBC transfusion. Follow-up hemoglobin * Neutropenia.  follow-up CBC.  *Septic shock with UTI and pneumonia (hypotension, tachycardia, leukopenia, AIDS). Hypotension improved but still low side,  tachycardia improved,  Now on Rocephin  and ganciclovir per  Dr. Sampson Goon. Follow-up blood culture (negative so far) and urine culture (E Coli).   *Hypoglycemia , . Improved. Oral intake is better With therapy. *Sacral DU stage III. Wound care.   The patient needs hospice care at home.   All the records are reviewed and case discussed with Care Management/Social Workerr. Management plans  discussed with the patient, her daughter and they are in agreement.   CODE STATUS: Full code  TOTAL TIME TAKING CARE OF THIS PATIENT: 45 minutes.   POSSIBLE D/C IN ? DAYS, DEPENDING ON CLINICAL CONDITION.   Katharina Caper M.D on 03/09/2015 at 1:56 PM  Between 7am to 6pm - Pager - [phone]  After 6pm go to [email or web address] - password [email or web address] [facility]  [facility]  Office  [phone]  CC: Primary care physician; WOODS, Garnette Czech, MD

## 2015-03-09 NOTE — Progress Notes (Signed)
Chaplain engaged patient and offered support and presence. Patient expressed need for nurse and chaplain engaged staff in that regard. Chaplain will follow up with unit chaplain for follow up.

## 2015-03-09 NOTE — Progress Notes (Signed)
Communicated/Educated pt on  the importance of taking her medications. Tried offering pt her meds several times throughout the day, but Pt refused morning and midday medications. Complained of stomach discomfort, Zophran given via IV, when reassessed pt stated she was still uncomfortable. Pain medication, drinks, and repositioning were all offered but pt still refuses.

## 2015-03-09 NOTE — Progress Notes (Signed)
Pt complains of stomach discomfort but refuses medication when offered x2.

## 2015-03-10 LAB — DIFFERENTIAL
BAND NEUTROPHILS: 0 %
BASOS ABS: 0 10*3/uL (ref 0–0.1)
BASOS PCT: 0 %
BLASTS: 0 %
EOS ABS: 0 10*3/uL (ref 0–0.7)
Eosinophils Relative: 1 %
LYMPHS ABS: 0.1 10*3/uL — AB (ref 1.0–3.6)
Lymphocytes Relative: 6 %
METAMYELOCYTES PCT: 0 %
MONO ABS: 0.2 10*3/uL (ref 0.2–0.9)
MONOS PCT: 13 %
Myelocytes: 0 %
NEUTROS ABS: 1.4 10*3/uL (ref 1.4–6.5)
Neutrophils Relative %: 80 %
Other: 0 %
Promyelocytes Absolute: 0 %
SMEAR REVIEW: ADEQUATE
nRBC: 0 /100 WBC

## 2015-03-10 LAB — CBC
HCT: 24.7 % — ABNORMAL LOW (ref 35.0–47.0)
Hemoglobin: 8.1 g/dL — ABNORMAL LOW (ref 12.0–16.0)
MCH: 27.7 pg (ref 26.0–34.0)
MCHC: 32.8 g/dL (ref 32.0–36.0)
MCV: 84.6 fL (ref 80.0–100.0)
PLATELETS: 163 10*3/uL (ref 150–440)
RBC: 2.92 MIL/uL — AB (ref 3.80–5.20)
RDW: 17 % — AB (ref 11.5–14.5)
WBC: 1.7 10*3/uL — AB (ref 3.6–11.0)

## 2015-03-10 MED ORDER — SUCRALFATE 1 GM/10ML PO SUSP
1.0000 g | Freq: Three times a day (TID) | ORAL | Status: DC
  Administered 2015-03-10 – 2015-03-12 (×6): 1 g via ORAL
  Filled 2015-03-10 (×8): qty 10

## 2015-03-10 MED ORDER — FOSCARNET SODIUM 6000 MG/250ML IV SOLN
2.4000 mg | Freq: Once | INTRAVENOUS | Status: AC
  Administered 2015-03-10: 14:00:00 24 mg via INTRAVITREAL
  Filled 2015-03-10: qty 1

## 2015-03-10 MED ORDER — LIDOCAINE HCL (PF) 1 % IJ SOLN
2.0000 mL | Freq: Once | INTRAMUSCULAR | Status: AC
  Administered 2015-03-10: 2 mL
  Filled 2015-03-10: qty 2

## 2015-03-10 NOTE — Progress Notes (Signed)
Pt alert and oriented, needs assistance with all ADLS, very weak, no appetite. She has refused her sodium chloride pill x3 today. Pt states she wil take it before bed tonight. Pt had a medication injected into the left eye by Dr. Sharman CrateBrassington at the bedside today, pt tolerated well. When asked what her pain scale was after the procedure, she stated it was a 6, but refused pain medication when it was offered. Will continue to monitor.

## 2015-03-10 NOTE — Progress Notes (Signed)
[facility] [place] at [facility]   PATIENT NAME: Donna Hayden    MR#:  [id number]  DATE OF BIRTH:  1970/06/24  SUBJECTIVE:  CHIEF COMPLAINT:   Chief Complaint  Patient presents with  . Shoulder Injury   Complained of abdominal pain during the nighttime and refused that her oral medications, would like to continue intravenous medications, but refuses oral. , wants to go home, still has difficulty swallowing, especially larger pills. on [place] 3L. fever is subsiding . Initiated on ganciclovir for CMV retinitis intravenously, need to have PICC line placed, but because of her inability or unwillingness to take HIV medications, it would not make much difference.  Palliative care is consulted evaluation and discussion is pending .  REVIEW OF SYSTEMS:  CONSTITUTIONAL: Has fever and weakness.  EYES: No blurred or double vision.  EARS, NOSE, AND THROAT: No tinnitus or ear pain.  RESPIRATORY: No cough, shortness of breath, wheezing or hemoptysis.  CARDIOVASCULAR: No chest pain, orthopnea, edema.  GASTROINTESTINAL: No nausea, vomiting, diarrhea or abdominal pain.  GENITOURINARY: No dysuria, hematuria.  ENDOCRINE: No polyuria, nocturia,  HEMATOLOGY: No anemia, easy bruising or bleeding SKIN: No rash or lesion. MUSCULOSKELETAL: No joint pain or arthritis. NEUROLOGIC: No tingling, numbness, weakness.  PSYCHIATRY: No anxiety or depression.   DRUG ALLERGIES:   Allergies  Allergen Reactions  . Penicillins Hives  . Sulfa Antibiotics Nausea Only and Rash    Rash and nausea. No breathing issues    VITALS:  Blood pressure 118/68, pulse 90, temperature 98.3 F (36.8 C), temperature source Oral, resp. rate 18, height 5' (1.524 m), weight 25.401 kg (56 lb), SpO2 97 %.  PHYSICAL EXAMINATION:  GENERAL:  44 y.o.-year-old patient lying in the bed with no acute distress. Chronically ill-appearing and malnurished, wasted. EYES: Pupils equal, round, reactive to light and  accommodation. No scleral icterus. Extraocular muscles intact.  HEENT: Head atraumatic, normocephalic. Oropharynx and nasopharynx clear.  NECK:  Supple, no jugular venous distention. No thyroid enlargement, no tenderness.  LUNGS: Normal breath sounds bilaterally, no wheezing, but has crackles on left side. No use of accessory muscles of respiration. No air leak from chest tube suction. CARDIOVASCULAR: S1, S2 normal. No murmurs, rubs, or gallops.  ABDOMEN: Soft, nontender, nondistended. Bowel sounds present. No organomegaly or mass.  EXTREMITIES: No pedal edema, cyanosis, or clubbing.  NEUROLOGIC: Cranial nerves II through XII are intact. Muscle strength 4/5 in upper and 2/5 in lower extremities. Sensation intact. Gait not checked.  PSYCHIATRIC: The patient is alert and oriented x 3.  SKIN: Sacral DU stage III.   LABORATORY PANEL:   CBC  Recent Labs Lab 03/10/15 0508  WBC 1.7*  HGB 8.1*  HCT 24.7*  PLT 163   ------------------------------------------------------------------------------------------------------------------  Chemistries   Recent Labs Lab 03/08/15 0516  NA 133*  K 3.7  CL 109  CO2 21*  GLUCOSE 115*  BUN <5*  CREATININE 0.30*  CALCIUM 7.4*  MG 1.9   ------------------------------------------------------------------------------------------------------------------  Cardiac Enzymes No results for input(s): TROPONINI in the last 168 hours. ------------------------------------------------------------------------------------------------------------------  RADIOLOGY:  No results found.  EKG:  No orders found for this or any previous visit.  ASSESSMENT AND PLAN:   1, Right-sided  traumatic pneumothorax.  Removed chest tube by Dr. Sharmon Revere. Repeated chest x-ray: No pneumothorax  But chest CT show bilateral lower lobe opacity, left side is worse than right side, now on the Rocephin, blood cultures are negative, sputum cultures are not obtained  2.  Hyponatremia. Improving,  discontinued  D5-normal saline IV, follow-up BMP in a.m.  3. Hypokalemia.Resolved on  IV and by mouth potassium supplement.  4 Hypomagnesemia. Improved with IV magnesium.  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 I'm afraid patient is just not willing to to try her best, getting palliative care involved. She would benefit from hospice care, although when discussed with her, she expressed desire to continue medication therapy. Started ART by Dr. Sampson GoonFitzgerald, although not taking. CD 4 ct 29 in Sept 2016. CD3+CD4+cells/CD3+CD8+Cells: 0.14.Patient was supposed to discuss her illness with her daughter, but so far was not able to do that, getting palliative care involved to facilitate that discussion. Getting social workers involved to help the patient to understand risks which she poses to her daughter and address them.   6. 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 Appreciate  Dr Karalee HeightBrassington's input   Now on  ganciclovir  intravenously per Dr. Sampson GoonFitzgerald, patient will need a PICC line for intravenous infusion which she is willing to get however, due to her underlying poor condition and and willingness to continue HIV medications. I am not sure if it is going to make difference in the long run. Awaiting for palliative care involvement  7Severe malnutrition. Follow-up Dietitian. Need GI consult for odynophagia and weight loss per Dr. Sampson GoonFitzgerald. Per Dr. Shelle Ironein, cannot get EGD due to hyponatremia and sepsis. Continue patient on Diflucan, adding Carafate.    8Anemia of chronic disease. Hemoglobin decreased to 6.7 and up to 8.5 after 1 unit PRBC transfusion. Follow-up hemoglobin  9 Neutropenia.  Worsening get absolute neutrophil count and place patient in neutropenic isolation.  10. Septic shock with UTI and pneumonia (hypotension, tachycardia, leukopenia,  AIDS). Hypotension improved but still low side,  tachycardia improved,  Now on Rocephin  and ganciclovir per  Dr. Sampson GoonFitzgerald. Follow-up blood culture (negative so far) and urine culture (E Coli).    11 Hypoglycemia , . Improved. Oral intake is some better With therapy.  12 Sacral DU stage III. Wound care.   The patient needs hospice care at home.   All the records are reviewed and case discussed with Care Management/Social Workerr. Management plans discussed with the patient, her daughter and they are in agreement.   CODE STATUS: Full code  TOTAL TIME TAKING CARE OF THIS PATIENT: 45 minutes.   POSSIBLE D/C IN ? DAYS, DEPENDING ON CLINICAL CONDITION.   Katharina CaperVAICKUTE,Ashna Dorough M.D on 03/10/2015 at 1:28 PM  Between 7am to 6pm - Pager - [phone]  After 6pm go to [email or web address] - password [email or web address] [facility]  [facility]  Office  [phone]  CC: Primary care physician; WOODS, Garnette CzechHRISTOPHER WILDRICK, MD

## 2015-03-10 NOTE — Clinical Social Work Note (Signed)
CSW has noted consult placed by attending physician today. Dr. Sampson GoonFitzgerald is already addressing the concern about the daughter not being aware of patient's diagnosis. CSW will follow up with Dr Sampson GoonFitzgerald tomorrow regarding status.  York SpanielMonica Chibuike Fleek MSW,LCSW [phone]

## 2015-03-10 NOTE — Plan of Care (Addendum)
C/o abdominal pain throughout shift but refused medications when offered. VSS, afebrile. IVF infusing as ordered. L eye improving. Remains on 2L O2 w/ stable sats. Foley remains w/ adequate output. Will continue to assess.    Problem: Safety: Goal: Ability to remain free from injury will improve Outcome: Progressing High fall risk. Bed alarm on, hourly rounding. Pt remains in bed requesting to be repositioned multiple times each hour.   Problem: Health Behavior/Discharge Planning: Goal: Ability to manage health-related needs will improve Outcome: Progressing Pt very anxious tonight calling out frequently. States she is ready to return home and have her daughter care for her again. Emotional support given. Care management, social work, Hospice and spiritual care working w/ pt daily.   Problem: Pain Managment: Goal: General experience of comfort will improve Outcome: Progressing C/o generalized pain and abdominal discomfort. Refused medication x3. Repositioned frequently.   Problem: Physical Regulation: Goal: Ability to maintain clinical measurements within normal limits will improve Outcome: Progressing Extreme generalized weakness, bilateral lower extremities contracted. Encouraged to hold cup when asked to hold her cup to drink. Pt able to hold cup independently and drink w/o problem.   Problem: Skin Integrity: Goal: Risk for impaired skin integrity will decrease Outcome: Progressing Foam in place for sacral pressure ulcer. Skin care provided. Repositioned every 2hrs.  Problem: Nutrition: Goal: Adequate nutrition will be maintained Outcome: Progressing Pt ate half graham cracker and some grape juice this shift.

## 2015-03-11 DIAGNOSIS — N390 Urinary tract infection, site not specified: Secondary | ICD-10-CM

## 2015-03-11 DIAGNOSIS — B259 Cytomegaloviral disease, unspecified: Secondary | ICD-10-CM

## 2015-03-11 DIAGNOSIS — R1310 Dysphagia, unspecified: Secondary | ICD-10-CM

## 2015-03-11 DIAGNOSIS — B20 Human immunodeficiency virus [HIV] disease: Secondary | ICD-10-CM

## 2015-03-11 DIAGNOSIS — H3090 Unspecified chorioretinal inflammation, unspecified eye: Secondary | ICD-10-CM

## 2015-03-11 DIAGNOSIS — B3781 Candidal esophagitis: Secondary | ICD-10-CM

## 2015-03-11 DIAGNOSIS — A419 Sepsis, unspecified organism: Secondary | ICD-10-CM

## 2015-03-11 DIAGNOSIS — J189 Pneumonia, unspecified organism: Secondary | ICD-10-CM

## 2015-03-11 LAB — TOXOPLASMA ANTIBODIES- IGG AND  IGM: Toxoplasma Antibody- IgM: <3 AU/mL (ref 0.0–7.9)

## 2015-03-11 LAB — CULTURE, BLOOD (ROUTINE X 2)
CULTURE: NO GROWTH
CULTURE: NO GROWTH

## 2015-03-11 LAB — CMV (CYTOMEGALOVIRUS) DNA ULTRAQUANT, PCR
CMV DNA QUANT: 25200 [IU]/mL
LOG10 CMV QN DNA PL: 4.401 {Log_IU}/mL

## 2015-03-11 LAB — CRYPTOCOCCUS ANTIGEN, SERUM: CRYPTOCOCCUS ANTIGEN, SERUM: NEGATIVE

## 2015-03-11 MED ORDER — DAPSONE 100 MG PO TABS: 100.0000 mg | ORAL_TABLET | Freq: Every day | ORAL | Status: AC

## 2015-03-11 MED ORDER — DRONABINOL 2.5 MG PO CAPS
2.5000 mg | ORAL_CAPSULE | Freq: Two times a day (BID) | ORAL | Status: DC
  Administered 2015-03-11 – 2015-03-12 (×2): 2.5 mg via ORAL
  Filled 2015-03-11 (×2): qty 1

## 2015-03-11 MED ORDER — DOCUSATE SODIUM 100 MG PO CAPS: 100.0000 mg | ORAL_CAPSULE | Freq: Two times a day (BID) | ORAL | Status: AC

## 2015-03-11 MED ORDER — VALGANCICLOVIR HCL 50 MG/ML PO SOLR
900.0000 mg | Freq: Two times a day (BID) | ORAL | Status: DC
  Filled 2015-03-11: qty 18

## 2015-03-11 MED ORDER — FLUCONAZOLE 200 MG PO TABS: 200.0000 mg | ORAL_TABLET | Freq: Every day | ORAL | Status: DC

## 2015-03-11 MED ORDER — GANCICLOVIR 0.15 % OP GEL: 1.0000 [drp] | OPHTHALMIC | Status: DC

## 2015-03-11 MED ORDER — COLLAGENASE 250 UNIT/GM EX OINT: TOPICAL_OINTMENT | Freq: Every day | CUTANEOUS | Status: AC

## 2015-03-11 MED ORDER — OXYCODONE HCL 5 MG PO TABS: 5.0000 mg | ORAL_TABLET | ORAL | Status: AC | PRN

## 2015-03-11 MED ORDER — SUCRALFATE 1 GM/10ML PO SUSP: 1.0000 g | Freq: Three times a day (TID) | ORAL | Status: AC

## 2015-03-11 MED ORDER — DARUNAVIR ETHANOLATE 800 MG PO TABS: 800.0000 mg | ORAL_TABLET | Freq: Every day | ORAL | Status: AC

## 2015-03-11 MED ORDER — VALGANCICLOVIR HCL 50 MG/ML PO SOLR: 900.0000 mg | Freq: Every day | ORAL | Status: AC

## 2015-03-11 MED ORDER — IPRATROPIUM-ALBUTEROL 0.5-2.5 (3) MG/3ML IN SOLN: 3.0000 mL | RESPIRATORY_TRACT | Status: AC | PRN

## 2015-03-11 MED ORDER — ELVITEG-COBIC-EMTRICIT-TENOFAF 150-150-200-10 MG PO TABS: 1.0000 | ORAL_TABLET | Freq: Every day | ORAL | Status: AC

## 2015-03-11 NOTE — Progress Notes (Signed)
   03/11/15 0940  Clinical Encounter Type  Visited With Patient  Visit Type Follow-up;Spiritual support  Referral From Chaplain  Consult/Referral To Chaplain  Spiritual Encounters  Spiritual Needs Emotional  Follow up visit with patient.  Provided pastoral presence and support.  Pt said she is supposed to be going home today and is anxious to go.  Requested help to use the phone, but phone was not working.  I was unable to find pt's nurse so requested unit clerk to give information to nurse to help the patient with the phone.  Pt said she is doing much better today and eager to go home.  [employer] Automotive GroupChaplain Kelvon Giannini-pager [phone]

## 2015-03-11 NOTE — Plan of Care (Addendum)
Problem: Safety: Goal: Ability to remain free from injury will improve Outcome: Progressing High fall risk. Bed alarm on, hourly rounding. Pt remains in bed requesting to be repositioned multiple times each hour.   Problem: Health Behavior/Discharge Planning: Goal: Ability to manage health-related needs will improve Outcome: Progressing Pt states she is ready to return home and have her daughter care for her again. Emotional support given. Pt states she plans to tell daughter about her true health diagnosis (HIV) but does not want to tell family because "they will not understand"  Problem: Pain Managment: Goal: General experience of comfort will improve Outcome: Progressing No c/o pain tonight, resting comfortably in bed.   Problem: Skin Integrity: Goal: Risk for impaired skin integrity will decrease Outcome: Progressing Foam in place for sacral pressure ulcer. Skin care provided. Repositioned every 2hrs.  Problem: Nutrition: Goal: Adequate nutrition will be maintained Outcome: Progressing Continued poor PO intake. Encouraged pt importance of trying to eat but she says "nothing tastes right" and did not want to try anything offered.

## 2015-03-11 NOTE — Progress Notes (Signed)
Follow up visit made on new referral for [facility] services at home following discharge. Visit made with CSW Donna Hayden to discuss discharge plan/goals of care. Patient seen lying in bed, appears more alert than at last visit. She has taken her oral antiviral medications the past 2 days. 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 She was very adamant about going back to her home today. At this time patient does continue to require oxygen at 2 liters and a hospital bed, she has agreed to both after much discussion and education. Patient reported that her sisters Donna Hayden and Donna Hayden would be available to help care for her at home. She remains nonambulatory and has a foley catheter in place.  Clinical research associateWriter and CSW Donna Hayden did have a meeting with patient's sister Donna Hayden and spoke via speaker phone with sister Donna Hayden, both voiced their concern about patient going home,with only her 46 year old daughter as a care giver. Both also stated they were unable to provide the care patient needed. Current plan is for patient to remain in the hospital for another day, a family meeting has been set for tomorrow morning at 9:30 am with the patient and her two sisters to discuss her care needs and possible transition to the hospice home. She remains a full code at this time. CSW Donna SagoSarah and Digestive Health CenterCMRN Donna Hayden as well as attending Dr. Winona Hayden and Dr. Sampson Hayden are all aware of plan . Thank you Donna BarkerKaren Robertson RN, BSN, [facility], [facility] Liaison [phone] c

## 2015-03-11 NOTE — Consult Note (Signed)
Palliative Care Consult for goals of care. Writer and [facility] Flo Shanks, [place] met with patient, and then later with patient's sisters Sharmaine Base and Freda Munro, to discuss plans for patient being discharged. Discussed option for discharge to [place] to which patient refused. Patient is in agreement with Hospice services in the home, and reports that there will be 24 hour care provided by her 2 sisters and 77 year old daughter Raquel. Received permission for writer and Hospice RN to speak with patient's sister to confirm that the above is accurate. After meeting with sister Sharmaine Base and having a phone conversation with patient's other sister Freda Munro, both sisters have stated that they are not able to provide the level of care patient requires in the home. Sharmaine Base and Freda Munro both agree that this is a conversation that Estate agent needs to have with patient, with both her sisters present, in order to discuss other options for care (Hospice Home vs. SNF placement). Family meeting scheduled for 03/12/15 at 9:30am. This update given to Hassan Rowan, [employer] Manager and Judson Roch, CSW.and all verbalize agreement with this plan. Dr. Ola Spurr has also met with patient and daughter and is encouraging patient to consider Hospice Home should opportunity present, as she will not be able to go home without 24 hour care. He reports patient seems interested in the Hospice Home option. Will discuss in further detail during family meeting. SPOKE WITH DR. FITZGERALD @ 14:25  WHO HAS NOW MET WITH PATIENT AND DAUGHTER AND DAUGHTER IS NOW AWARE OF PATIENT'S AIDS DIAGNOSIS. PATIENT DOES NOT WANT FOR OTHER FAMILY MEMBERS TO KNOW AT THIS TIME.

## 2015-03-11 NOTE — Plan of Care (Signed)
Problem: Safety: Goal: Ability to remain free from injury will improve Outcome: Progressing High fall risk. Bed alarm on, hourly rounding. Pt remains in bed requesting to be repositioned multiple times each hour.       Problem: Health Behavior/Discharge Planning: Goal: Ability to manage health-related needs will improve Outcome: Progressing Pt states she is ready to return home and have her daughter care for her again. Emotional support given. Pt told her daughter about her HIV status today.    Problem: Pain Managment: Goal: General experience of comfort will improve Outcome: Progressing No c/o pain tonight, resting comfortably in bed.       Problem: Physical Regulation: Goal: Ability to maintain clinical measurements within normal limits will improve Outcome: Progressing Foam in place for sacral pressure ulcer. Skin care provided. Repositioned every 2hrs.        Problem: Skin Integrity: Goal: Risk for impaired skin integrity will decrease Outcome: Progressing Continued poor PO intake. Encouraged pt importance of trying to eat but she says "nothing tastes right" and did not want to try anything offered.

## 2015-03-11 NOTE — Care Management Important Message (Signed)
Important Message  Patient Details  Name: Donna LarssonStephanie Hayden MRN: [id number] Date of Birth: 1970/09/01   Medicare Important Message Given:  Yes    Gwenette GreetBrenda S Shavawn Stobaugh, RN 03/11/2015, 11:07 AM

## 2015-03-11 NOTE — Progress Notes (Signed)
[facility] [place] [facility]   PATIENT NAME: Donna LarssonStephanie Hayden    MR#:  [id number]  DATE OF BIRTH:  08-Feb-1971  SUBJECTIVE:  CHIEF COMPLAINT:   Chief Complaint  Patient presents with  . Shoulder Injury   still refuses HEV medications intermittently. Denies abdominal pain, admits of Carafate being helpful. Oral intake has improved some. Wants to go home. Dr. Sampson GoonFitzgerald recommends oral medications for CMV retinitis REVIEW OF SYSTEMS:  CONSTITUTIONAL: Has fever and weakness.  EYES: No blurred or double vision.  EARS, NOSE, AND THROAT: No tinnitus or ear pain.  RESPIRATORY: No cough, shortness of breath, wheezing or hemoptysis.  CARDIOVASCULAR: No chest pain, orthopnea, edema.  GASTROINTESTINAL: No nausea, vomiting, diarrhea or abdominal pain.  GENITOURINARY: No dysuria, hematuria.  ENDOCRINE: No polyuria, nocturia,  HEMATOLOGY: No anemia, easy bruising or bleeding SKIN: No rash or lesion. MUSCULOSKELETAL: No joint pain or arthritis. NEUROLOGIC: No tingling, numbness, weakness.  PSYCHIATRY: No anxiety or depression.   DRUG ALLERGIES:   Allergies  Allergen Reactions  . Penicillins Hives  . Sulfa Antibiotics Nausea Only and Rash    Rash and nausea. No breathing issues    VITALS:  Blood pressure 117/62, pulse 98, temperature 98.7 F (37.1 C), temperature source Oral, resp. rate 19, height 5' (1.524 m), weight 25.401 kg (56 lb), SpO2 95 %.  PHYSICAL EXAMINATION:  GENERAL:  44 y.o.-year-old patient lying in the bed with no acute distress. Chronically ill-appearing and malnurished, wasted. EYES: Pupils equal, round, reactive to light and accommodation. No scleral icterus. Extraocular muscles intact.  HEENT: Head atraumatic, normocephalic. Oropharynx and nasopharynx clear.  NECK:  Supple, no jugular venous distention. No thyroid enlargement, no tenderness.  LUNGS: Normal breath sounds bilaterally, no wheezing, but has crackles on left side. No use of  accessory muscles of respiration. No air leak from chest tube suction. CARDIOVASCULAR: S1, S2 normal. No murmurs, rubs, or gallops.  ABDOMEN: Soft, nontender, nondistended. Bowel sounds present. No organomegaly or mass.  EXTREMITIES: No pedal edema, cyanosis, or clubbing.  NEUROLOGIC: Cranial nerves II through XII are intact. Muscle strength 4/5 in upper and 2/5 in lower extremities. Sensation intact. Gait not checked.  PSYCHIATRIC: The patient is alert and oriented x 3.  SKIN: Sacral DU stage III.   LABORATORY PANEL:   CBC  Recent Labs Lab 03/10/15 0508  WBC 1.7*  HGB 8.1*  HCT 24.7*  PLT 163   ------------------------------------------------------------------------------------------------------------------  Chemistries   Recent Labs Lab 03/08/15 0516  NA 133*  K 3.7  CL 109  CO2 21*  GLUCOSE 115*  BUN <5*  CREATININE 0.30*  CALCIUM 7.4*  MG 1.9   ------------------------------------------------------------------------------------------------------------------  Cardiac Enzymes No results for input(s): TROPONINI in the last 168 hours. ------------------------------------------------------------------------------------------------------------------  RADIOLOGY:  No results found.  EKG:  No orders found for this or any previous visit.  ASSESSMENT AND PLAN:   1, Right-sided  traumatic pneumothorax.  Removed chest tube by Dr. Sharmon RevereLindquist. Repeated chest x-ray: No pneumothorax  But chest CT show bilateral lower lobe opacity, left side is worse than right side, now on Zithromax, completed course of Rocephin, blood cultures are negative, sputum cultures are not obtained  2. Hyponatremia. Improving,  discontinued D5-normal saline IV, follow-up BMP closely  3. Hypokalemia.Resolved on  IV and by mouth potassium supplement.  4 Hypomagnesemia. Improved with IV magnesium.  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 Discussed with Dr.  Sampson Goon who recommended to continue therapy and further discussions/education with patient CD 4 ct 12 in Sept 2016. CD3+CD4+cells/CD3+CD8+Cells: 0.14.Dr. Sampson Goon discussed illness with patient's daughter. Hospice is involved and patient will be discharged home with hospice follow-up as outpatient 6. 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 Appreciate  Dr Karalee Height input   Now initiated on valganciclovir  orally  7Severe malnutrition.  Continue patient on Diflucan, proved overall, continue Carafate. Added dronabinol for appetite stimulation, at least temporarily   8Anemia of chronic disease. Hemoglobin decreased to 6.7 and up to 8.5 after 1 unit PRBC transfusion. Follow-up hemoglobin  9 Neutropenia.   neutropenic precautions.  10. Septic shock due to UTI and pneumonia (hypotension, tachycardia, leukopenia, AIDS). Hypotension improved but still low side, likely due to malnutrition/hypoalbuminemia,   tachycardia improved,  related course of Rocephin  and now on valanciclovir per  Dr. Sampson Goon. Follow-up blood culture (negative so far) and urine culture (E Coli).    11 Hypoglycemia , . Improved. Oral intake is some better With therapy.  12 Sacral DU stage III. Wound care.   The patient needs hospice care at home.   All the records are reviewed and case discussed with Care Management/Social Workerr. Management plans discussed with the patient, her daughter and they are in agreement.   CODE STATUS: Full code  TOTAL TIME TAKING CARE OF THIS PATIENT: 45 minutes.  Coordination of care time 20 minutes POSSIBLE D/C IN ? DAYS, DEPENDING ON CLINICAL CONDITION.   Katharina Caper M.D on 03/11/2015 at 3:09 PM  Between 7am to 6pm - Pager - [phone]  After 6pm go to [email or web address] - password [email or web address] [facility]  [facility]  Office  [phone]  CC: Primary care physician; WOODS,  Garnette Czech, MD

## 2015-03-11 NOTE — Progress Notes (Signed)
Patient ID: Pricilla LarssonStephanie Dax, female   DOB: [birth year], 44 y.o.   MRN: [id number] Pt was examined and procedure performed on 03/10/15.  Notes of 11/13 exam and intravitreal injection were handwritten.  If patient is to be discharged: Oral valgancyclovir. The [facility] should [facility] or [facility] service due to need for continued intravitreal foscavir injections twice weekly.  (This is not a service I, or my [facility] colleague, would be able to manage effectively as an outpatient.)  I have spoken with [facility] fellow regarding this patient, and he should be at least vaguely familiar with the situation.  [facility], we will follow up with a repeat exam and injection midweek (Wednesday.)

## 2015-03-11 NOTE — Progress Notes (Signed)
Made Dr. Winona LegatoVaickute aware that pt no longer going home due to hospice not being able to have equipment at the home today.  Dr. Seth BakeV ok with pt having no IV access.  Orson Apeanielle Elliott Lasecki, RN

## 2015-03-11 NOTE — Progress Notes (Signed)
Physical Therapy Treatment Patient Details Name: Donna Hayden MRN: [id number] DOB: [birth year] Today's Date: 03/11/2015    History of Present Illness Pt is a 44 y.o. female presenting to hospital s/p fall 03/01/15 onto R shoulder sustaining R pneumothorax.  S/p R chest tube placement 03/01/15.  R shoulder and L hip imaging negative for fx.  PMH includes HIV, anemia, stage 3 pressure ulcer L buttock.    PT Comments    Pt is extremely weak, deconditioned, and malnourished. She requires dependent assistance for all bed mobility and attempted transfers. Pt unable to demonstrate active LAQ at edge of bed without palpable quad contraction. Unable to extend knees and hip with attempted sit to stand transfer. Unable to ambulate. Per medical record pt has been bed bound for the last 2 months with dependent care from daughter. Per discharge planning team [facility] is consulting on patient's care. Overall prognosis to demonstrate improvement with therapy is very poor. Pt will need dependent care at discharge. Hospice can assess for PT needs but at this time it does not appear that pt is appropriate for physical therapy. However will keep patient on caseload while admitted to see if she can demonstrate any measurable improvement with therapy. Pt will benefit from skilled PT services to address deficits in strength, balance, and mobility in order to return to full function at home.    Follow Up Recommendations  Supervision/Assistance - 24 hour;Home health PT (Hospice to assess, extremely poor prognosis for improvement)     Equipment Recommendations   (bed pan? BSC?, pt will be dependent care)    Recommendations for Other Services       Precautions / Restrictions Precautions Precautions: Fall Restrictions Weight Bearing Restrictions: No    Mobility  Bed Mobility Overal bed mobility: Needs Assistance Bed Mobility: Supine to Sit     Supine to sit: Total assist     General bed mobility comments:  Pt demonstrates bilateral knee flexion with difficulty extending knees. Extremely weak and needs depedent assist to come upright EOB. Once upright pt continually falls to the L as well as backwards. Needs continual support to sit upright with 10-15 second bouts of CGA only but fatigues and returns to falling sideways. Heavy UE support and HOB elevated to provide additional support to L side  Transfers Overall transfer level: Needs assistance Equipment used: Rolling walker (2 wheeled) Transfers: Sit to/from Stand Sit to Stand: Total assist;+2 physical assistance         General transfer comment: Pt agreeable to attempt transfers. Pt requires dependent +2 attempt and unable to demonstrate any meaningful strength to stand. She is unable to extend knees and hips and does is unable to come to standing. Attempted x 2 but pt unable to stand. Pt is extremely weak, cachetic and deconditioned. Unable to attempt ambulation  Ambulation/Gait                 Stairs            Wheelchair Mobility    Modified Rankin (Stroke Patients Only)       Balance Overall balance assessment: Needs assistance Sitting-balance support: Bilateral upper extremity supported Sitting balance-Leahy Scale: Poor Sitting balance - Comments: Pt with continual loss of balance to the L and posterior in sitting. Extremely weak although good effort provided by patient                            Cognition Arousal/Alertness: Awake/alert Behavior During  Therapy: WFL for tasks assessed/performed Overall Cognitive Status: Within Functional Limits for tasks assessed                      Exercises Other Exercises Other Exercises: Seated marches x 5 bilateral (pt requires assist due to weakness), Attempted LAQ but pt unable to actively extend knees, seated knee flexion x 10 bilateral (extremely weak)    General Comments        Pertinent Vitals/Pain Pain Assessment: No/denies pain    Home  Living                      Prior Function            PT Goals (current goals can now be found in the care plan section) Acute Rehab PT Goals Patient Stated Goal: to go home PT Goal Formulation: With patient Time For Goal Achievement: 03/18/15 Potential to Achieve Goals: Poor Progress towards PT goals: Not progressing toward goals - comment (Pt provides effort but extremely weak)    Frequency  Min 2X/week    PT Plan Current plan remains appropriate    Co-evaluation             End of Session Equipment Utilized During Treatment: Gait belt Activity Tolerance:  (Pt limited by severe weakness) Patient left: in bed;with call bell/phone within reach (CNA notified of desire for bed pain for toileting)     Time: 1000-1020 PT Time Calculation (min) (ACUTE ONLY): 20 min  Charges:  $Therapeutic Activity: 8-22 mins                    G Codes:      Sharalyn InkJason D Huprich PT, DPT   Huprich,Jason 03/11/2015, 11:10 AM

## 2015-03-11 NOTE — Progress Notes (Signed)
Spoke with Dr. Winona LegatoVaickute and made her aware that pt's IV infiltrated.  Dr. Seth BakeV does not want IV replaced as pt is discharging later today.  IV fluids and IV antivirals d/c'd per Dr. Mollie GermanyV.  Danielle Elery Cadenhead, RN

## 2015-03-11 NOTE — Progress Notes (Signed)
[facility] CLINIC INFECTIOUS DISEASE PROGRESS NOTE Date of Admission:  03/01/2015     ID: Donna Hayden is a 44 y.o. female with HIV/AIDS, traumatic PTX  Principal Problem:   Pneumothorax on right Active Problems:   Protein-calorie malnutrition, severe   Pressure ulcer   Sepsis (HCC)   UTI (urinary tract infection)   HIV disease (HCC)   CAP (community acquired pneumonia)   Dysphagia   Candida esophagitis (HCC)   CMV retinitis (HCC)   Subjective: Fevers resolved -Arm swollen. Vision still poor but had injection Lost IV access    ROS  Eleven systems are reviewed and negative except per hpi  Medications:  Antibiotics Given (last 72 hours)    Date/Time Action Medication Dose Rate   03/08/15 1731 Given   dapsone tablet 100 mg 100 mg    03/08/15 1731 Given   Darunavir Ethanolate (PREZISTA) tablet 800 mg 800 mg    03/08/15 1732 Given   elvitegravir-cobicistat-emtricitabine-tenofovir (GENVOYA) 150-150-200-10 MG tablet 1 tablet 1 tablet    03/08/15 1732 Given   cefTRIAXone (ROCEPHIN) 2 g in dextrose 5 % 50 mL IVPB 2 g 100 mL/hr   03/08/15 2249 Given   azithromycin (ZITHROMAX) 200 MG/5ML suspension 600 mg 600 mg    03/09/15 0049 Given   ganciclovir (CYTOVENE) 65 mg in sodium chloride 0.9 % 100 mL IVPB 65 mg 100 mL/hr   03/09/15 1353 Given   ganciclovir (CYTOVENE) 65 mg in sodium chloride 0.9 % 100 mL IVPB 65 mg 100 mL/hr   03/09/15 1551 Given   cefTRIAXone (ROCEPHIN) 2 g in dextrose 5 % 50 mL IVPB 2 g 100 mL/hr   03/09/15 2055 Given   ganciclovir (CYTOVENE) 65 mg in sodium chloride 0.9 % 100 mL IVPB 65 mg 100 mL/hr   03/10/15 0957 Given   dapsone tablet 100 mg 100 mg    03/10/15 0957 Given   Darunavir Ethanolate (PREZISTA) tablet 800 mg 800 mg    03/10/15 0957 Given   elvitegravir-cobicistat-emtricitabine-tenofovir (GENVOYA) 150-150-200-10 MG tablet 1 tablet 1 tablet    03/10/15 1018 Given   ganciclovir (CYTOVENE) 65 mg in sodium chloride 0.9 % 100 mL IVPB 65 mg 100 mL/hr    03/10/15 1424 Given   foscarnet (FOSCAVIR) injection 24 mg 24 mg    03/10/15 1528 Given   cefTRIAXone (ROCEPHIN) 2 g in dextrose 5 % 50 mL IVPB 2 g 100 mL/hr   03/10/15 2132 Given   ganciclovir (CYTOVENE) 65 mg in sodium chloride 0.9 % 100 mL IVPB 65 mg 100 mL/hr   03/11/15 0837 Given   dapsone tablet 100 mg 100 mg    03/11/15 1610 Given   Darunavir Ethanolate (PREZISTA) tablet 800 mg 800 mg    03/11/15 0837 Given   elvitegravir-cobicistat-emtricitabine-tenofovir (GENVOYA) 150-150-200-10 MG tablet 1 tablet 1 tablet    03/11/15 1057 Given   ganciclovir (CYTOVENE) 65 mg in sodium chloride 0.9 % 100 mL IVPB 65 mg 100 mL/hr     . antiseptic oral rinse  7 mL Mouth Rinse q12n4p  . azithromycin  600 mg Oral Weekly  . cefTRIAXone (ROCEPHIN)  IV  2 g Intravenous Q24H  . collagenase   Topical Daily  . dapsone  100 mg Oral Daily  . darunavir  800 mg Oral Q breakfast  . elvitegravir-cobicistat-emtricitabine-tenofovir  1 tablet Oral Q breakfast  . fluconazole  200 mg Oral Daily  . Ganciclovir  1 drop Left Eye Q3H while awake  . heparin  5,000 Units Subcutaneous 3 times per day  .  sodium chloride  1 g Oral TID WC  . sucralfate  1 g Oral TID WC & HS   Objective: Vital signs in last 24 hours: Temp:  [97.5 F (36.4 C)-98.7 F (37.1 C)] 98.7 F (37.1 C) (11/13 2112) Pulse Rate:  [92-103] 92 (11/14 0530) Resp:  [18-20] 18 (11/14 0530) BP: (103-115)/(61-63) 115/63 mmHg (11/14 0530) SpO2:  [91 %-99 %] 91 % (11/14 1215) Constitutional: Cachectic HENT: [place]/AT, L eye exophthalmos, injected, makred decreased vision L eye  To finger counting,  no scleral icterus Mouth/Throat: Oropharynx is clear and moist. No oropharyngeal exudate.  Cardiovascular: Normal rate, regular rhythm and normal heart sounds. Pulmonary/Chest: midl rhonchi bil, good air movement Neck = supple, no nuchal rigidity Abdominal: Soft. Bowel sounds are normal. exhibits no distension. There is no tenderness.  Lymphadenopathy:  no cervical adenopathy. No axillary adenopathy Neurological: alert and oriented to person, place, and time.  Skin: Skin is warm and dry. No rash noted. No erythema.  Psychiatric: flat affect   Lab Results  Recent Labs  03/09/15 0450 03/10/15 0508  WBC 2.9* 1.7*  HGB 8.2* 8.1*  HCT 24.2* 24.7*   Studies/Results: No results found.  Assessment/Plan:  Donna Hayden is a 44 y.o. female with advanced AIDS CD 4 ct 70 in Sept 2016, off meds, previously followed at [facility] who was admitted with traumatic ptx after a fall. She has had odynophagia and has had work up as otpt but no showed for multiple EGD attempts.  She has bene treated for thrush and is also on azithromycin weekly and started dapsone recently for PCP PX (Bactrim allergy, nml G6PD activity). She has multiple other medical issues, is quite frail, losing wt, largely immobile.  I reviewed her notes from [facility] and her prior labs.  She has not had an undetectable viral load in many years and her CD4 ct has continued to decline. I think she has a long history of non compliance as the most likely cause.  UA +, UCX E coli. Has new vision loss L eye injection. Recommendations Today I spoke with the patient and her daughter and disclosed to her daughter her HIV status.  Patient, daughter and I spoke about her care going forward and our hope that with ARV treatment she can still reconstitute her immune system and recover.  Discussed most important tasks she can work on are 100% compliance with her meds and taking in as many calories as possible.    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 But she was confused about it and was refusing it. I had long talk with her today and she has agreed to take all the medicines we  prescribe.  She will restart.  Monitor for IRIS as very high risk  PTX and infiltrate - s/p 5 days ceftazidime/ceftraixone   Sepsis with UTI - s/p 5 days ceftazidime/ ceftriaxone  Course complete   Prophylaxis Cont weely azithro (for  MAI),dapsone (for PCP)  Dysphagia- responded to prolonged fluc so likely candidal esophagitis. Can postpone EGD to otpt   Advised her she has < 6 months to live likely but if she takes her ART she may have recovery. I advised her to accept home hospice and cooperate with all recs. She has agreed.  Thank you very much for the consult. Will follow with you.  Donna Hayden   03/11/2015, 1:46 PM

## 2015-03-12 LAB — CBC
HEMATOCRIT: 24.9 % — AB (ref 35.0–47.0)
Hemoglobin: 8 g/dL — ABNORMAL LOW (ref 12.0–16.0)
MCH: 27.3 pg (ref 26.0–34.0)
MCHC: 32.3 g/dL (ref 32.0–36.0)
MCV: 84.5 fL (ref 80.0–100.0)
Platelets: 168 10*3/uL (ref 150–440)
RBC: 2.94 MIL/uL — AB (ref 3.80–5.20)
RDW: 16.6 % — ABNORMAL HIGH (ref 11.5–14.5)
WBC: 3.1 10*3/uL — AB (ref 3.6–11.0)

## 2015-03-12 MED ORDER — DRONABINOL 2.5 MG PO CAPS: 2.5000 mg | ORAL_CAPSULE | Freq: Two times a day (BID) | ORAL | Status: AC

## 2015-03-12 NOTE — Progress Notes (Addendum)
Follow up visit and family meeting held regarding discharge plan. Patient's sister's Donna Hayden, Donna Hayden and Palliative Care Social Worker Donna Hayden present. Current plan is for patient to return to her home and be admitted to hospice services. Her sisters will try to provide care as much as possible, one lives in [place], the other has several children in her home, patient lives with her 363 year old daughter. DME needs: electric hospital bed with soft system mattress and oxygen, equipment has been ordered for delivery as soon as possible. Donna Hayden has agreed to be the contact for the equipment delivery [phone]) and Hospice admission. Patient and family as well as hospital care team all in agreement with this plan. Patient remains a Full code. Thank you. Donna BarkerKaren Robertson RN, BSN, [facility], [facility] Liaison [phone] c

## 2015-03-12 NOTE — Care Management (Signed)
Discharge to home today per Dr. Winona LegatoVaickute. Will be followed by [facility] for services in the home. Telephone call to Clide CliffMargaret Williams at [facility] to inform her that Ms, Nunzio CoryLyons will be discharged to home today. [place] for personal care services.  Telephone call (voice mail) to Oletta LamasBeth Childs Social Worker at [facility] with update of discharge plans. Will be transported home per [facility] to her home later today after equipment has been delivered to her home. Gwenette GreetBrenda S Kearstin Learn RN MSN CCM Care Management [phone]

## 2015-03-12 NOTE — Clinical Social Work Note (Signed)
Pt is ready for discharge today and will [facility]. A Hospice SW will be following pt at home. CSW made a report to [place] [employer] CPS as pt's daughter has not been in school and has been pt's primary care giver for 2 months. CS[facility] social worker and hospital liaison of report. CSW is signing off as no further needs identified.   Dede QuerySarah Jacie Tristan, MSW, LCSW Clinical Social Worker  [phone]

## 2015-03-12 NOTE — Progress Notes (Signed)
[facility]INIC INFECTIOUS DISEASE PROGRESS NOTE Date of Admission:  03/01/2015     ID: Donna LarssonStephanie Hayden is a 44 y.o. female with HIV/AIDS, traumatic PTX  Principal Problem:   Pneumothorax on right Active Problems:   Protein-calorie malnutrition, severe   Pressure ulcer   Sepsis (HCC)   UTI (urinary tract infection)   HIV disease (HCC)   CAP (community acquired pneumonia)   Dysphagia   Candida esophagitis (HCC)   CMV retinitis (HCC)   Subjective: Fevers resolved -Arm swollen. Vision still poor but had injection   ROS  Eleven systems are reviewed and negative except per hpi  Medications:  Antibiotics Given (last 72 hours)    Date/Time Action Medication Dose Rate   03/09/15 1551 Given   cefTRIAXone (ROCEPHIN) 2 g in dextrose 5 % 50 mL IVPB 2 g 100 mL/hr   03/09/15 2055 Given   ganciclovir (CYTOVENE) 65 mg in sodium chloride 0.9 % 100 mL IVPB 65 mg 100 mL/hr   03/10/15 0957 Given   dapsone tablet 100 mg 100 mg    03/10/15 0957 Given   Darunavir Ethanolate (PREZISTA) tablet 800 mg 800 mg    03/10/15 0957 Given   elvitegravir-cobicistat-emtricitabine-tenofovir (GENVOYA) 150-150-200-10 MG tablet 1 tablet 1 tablet    03/10/15 1018 Given   ganciclovir (CYTOVENE) 65 mg in sodium chloride 0.9 % 100 mL IVPB 65 mg 100 mL/hr   03/10/15 1424 Given   foscarnet (FOSCAVIR) injection 24 mg 24 mg    03/10/15 1528 Given   cefTRIAXone (ROCEPHIN) 2 g in dextrose 5 % 50 mL IVPB 2 g 100 mL/hr   03/10/15 2132 Given   ganciclovir (CYTOVENE) 65 mg in sodium chloride 0.9 % 100 mL IVPB 65 mg 100 mL/hr   03/11/15 0837 Given   dapsone tablet 100 mg 100 mg    03/11/15 [id number] Given   Darunavir Ethanolate (PREZISTA) tablet 800 mg 800 mg    03/11/15 0837 Given   elvitegravir-cobicistat-emtricitabine-tenofovir (GENVOYA) 150-150-200-10 MG tablet 1 tablet 1 tablet    03/11/15 1057 Given   ganciclovir (CYTOVENE) 65 mg in sodium chloride 0.9 % 100 mL IVPB 65 mg 100 mL/hr     . antiseptic oral rinse  7 mL  Mouth Rinse q12n4p  . azithromycin  600 mg Oral Weekly  . collagenase   Topical Daily  . dapsone  100 mg Oral Daily  . darunavir  800 mg Oral Q breakfast  . dronabinol  2.5 mg Oral BID AC  . elvitegravir-cobicistat-emtricitabine-tenofovir  1 tablet Oral Q breakfast  . fluconazole  200 mg Oral Daily  . Ganciclovir  1 drop Left Eye Q3H while awake  . heparin  5,000 Units Subcutaneous 3 times per day  . sodium chloride  1 g Oral TID WC  . sucralfate  1 g Oral TID WC & HS  . valganciclovir  900 mg Oral BID   Objective: Vital signs in last 24 hours: Temp:  [97.6 F (36.4 C)-98.5 F (36.9 C)] 97.6 F (36.4 C) (11/15 0458) Pulse Rate:  [85-98] 85 (11/15 0458) Resp:  [18] 18 (11/15 0458) BP: (102-103)/(47-62) 103/62 mmHg (11/15 0458) SpO2:  [95 %-98 %] 95 % (11/15 0458) Constitutional: Cachectic HENT: [place]/AT, L eye exophthalmos, injected, makred decreased vision L eye  To finger counting,  no scleral icterus Mouth/Throat: Oropharynx is clear and moist. No oropharyngeal exudate.  Cardiovascular: Normal rate, regular rhythm and normal heart sounds. Pulmonary/Chest: midl rhonchi bil, good air movement Neck = supple, no nuchal rigidity Abdominal: Soft. Bowel sounds  are normal. exhibits no distension. There is no tenderness.  Lymphadenopathy: no cervical adenopathy. No axillary adenopathy Neurological: alert and oriented to person, place, and time.  Skin: Skin is warm and dry. No rash noted. No erythema.  Psychiatric: flat affect   Lab Results  Recent Labs  03/10/15 0508 03/12/15 0530  WBC 1.7* 3.1*  HGB 8.1* 8.0*  HCT 24.7* 24.9*   Studies/Results: No results found.  Assessment/Plan:  Donna Hayden is a 44 y.o. female with advanced AIDS CD 4 ct 52 in Sept 2016, off meds, previously followed at [facility] who was admitted with traumatic ptx after a fall. She has had odynophagia and has had work up as otpt but no showed for multiple EGD attempts.  She has bene treated for thrush  and is also on azithromycin weekly and started dapsone recently for PCP PX (Bactrim allergy, nml G6PD activity). She has multiple other medical issues, is quite frail, losing wt, largely immobile.  I reviewed her notes from [facility] and her prior labs.  She has not had an undetectable viral load in many years and her CD4 ct has continued to decline. I think she has a long history of non compliance as the most likely cause.  UA +, UCX E coli. Has new vision loss L eye injection. Recommendations Today I spoke with the patient and her daughter and disclosed to her daughter her HIV status.  Patient, daughter and I spoke about her care going forward and our hope that with ARV treatment she can still reconstitute her immune system and recover.  Discussed most important tasks she can work on are 100% compliance with her meds and taking in as many calories as possible.    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 But she was confused about it and was refusing it. I had long talk with her today and she has agreed to take all the medicines we prescribe.  She will restart.  Monitor for IRIS as very high risk Crypto serum neg, CMV PCR +  PTX and infiltrate - s/p 5 days ceftazidime/ceftraixone   Sepsis with UTI - s/p 5 days ceftazidime/ ceftriaxone  Course complete   Prophylaxis Cont weely azithro (for  MAI),dapsone (for PCP)  Dysphagia- responded to prolonged fluc so likely candidal esophagitis. Can postpone EGD to otpt   Advised her she has < 6 months to live likely but if she takes her ART she may have recovery. I advised her to accept home hospice and cooperate with all recs. She has agreed. Thank you very much for the consult. Will follow with you.  Donna Hayden,  Donna Hayden   03/12/2015, 2:50 PM

## 2015-03-12 NOTE — Progress Notes (Addendum)
Palliative Care follow-up. Discussion with Care Manager Hassan Rowan, RN and social worker Judson Roch, [place] regarding patient's current status. Met with patient's sisters Sharmaine Base and Freda Munro to discuss patient's plans for discharge. At this time, patient is adamant about returning home and family reports they will try to commit as much time as possible to being present in the home and participating in patient's care. Patient is currently alert and oriented and has been advised of the safety concerns regarding being home alone. Current plan is for patient to discharge today via EMS and to be admitted to Hospice services in the home. Patient and family in agreement with this plan along with care team. Patient remains a FULL code at this time.  Atha Starks, MSW, LCSW Palliative Care Social Worker [phone] (c)

## 2015-03-12 NOTE — Discharge Summary (Signed)
[facility] [place] at [facility]   PATIENT NAME: Donna Hayden    MR#:  [id number]  DATE OF BIRTH:  1970/05/28  DATE OF ADMISSION:  03/01/2015 ADMITTING PHYSICIAN: Wyatt Haste, MD  DATE OF DISCHARGE: No discharge date for patient encounter.  PRIMARY CARE PHYSICIAN: WOODS, Garnette Czech, MD     ADMISSION DIAGNOSIS:  Pneumothorax [J93.9] Traumatic pneumothorax, initial encounter [S27.0XXA]  DISCHARGE DIAGNOSIS:  Principal Problem:   Pneumothorax on right Active Problems:   Sepsis (HCC)   UTI (urinary tract infection)   Protein-calorie malnutrition, severe   HIV disease (HCC)   CAP (community acquired pneumonia)   Dysphagia   Candida esophagitis (HCC)   CMV retinitis (HCC)   Pressure ulcer   SECONDARY DIAGNOSIS:   Past Medical History  Diagnosis Date  . HIV (human immunodeficiency virus infection) (HCC)   . Anemia     Iron Deficient  . Osteomyelitis (HCC) Bilateral Femoral  . Parotid cyst   . Pneumonia   . Pulmonary hypertension (HCC)   . Selective deficiency of IgG subclasses (HCC)   . Septic arthritis (HCC)   . Sickle cell trait (HCC)   . Shortness of breath dyspnea     .pro HOSPITAL COURSE:   Patient is 44 year old African-American female with history of HIV/AIDS, pulmonary hypertension who presents to the hospital with complaints of fall. After a fall. She felt immediate pain in the right side maxillary area and was having shortness of breath , on arrival to emergency room, she was noted to have large right-sided pneumothorax and the chest tube was placed . Chest x-ray also revealed heterogeneous opacities in left lower lobe concerning for possible pneumonia . Further labs revealed leukopenia, anemia , hypoalbuminemia , hyponatremia, hypokalemia. Patient was complaining of odynophagia and was not able to eat or take pills.  Patient continued to have high fever to 101 to 102. Since 03/04/2015. Urinalysis performed on November  9 revealed severe pyuria. She was initiated on broad-spectrum antibiotic therapy with vancomycin and Zosyn. Blood as well as urine cultures were sent . Urine culture revealed Escherichia coli, more than 100,000 colony-forming units, sensitive to all antibiotics, except trimethoprim sulfamethoxazole and levofloxacin . Blood cultures showed no growth . Patient was seen by Dr. Sampson Goon and followed along.  Dr. Sampson Goon initiated patient on not Diflucan for suspected candida esophagitis and patient's condition improved . It was felt that patient's EGD could be postponed as outpatient. She was started on HIV medications as well. 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 She was seen by ophthalmologist who felt the patient had CMV retinitis and patient was initiated on ganciclovir intravenously initially, which was changed to oral upon discharge.   Discussion by problem 1, Right-sided traumatic pneumothorax. Removed chest tube by Dr. Sharmon Revere. Repeated chest x-ray: No pneumothorax  But chest CT showed bilateral lower lobe opacities, left side is worse than right side, now on Zithromax, completed course of Rocephin, blood cultures are negative, sputum cultures are not obtained  2. Hyponatremia. Improved  3. Hypokalemia.Resolved on IV and by mouth potassium supplement.  4 Hypomagnesemia. Resolved with IV magnesium.  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 Patient will be discharged to home with hospice CD 4 ct 29 in Sept 2016. CD3+CD4+cells/CD3+CD8+Cells: 0.14.Dr. Sampson Goon discussed illness with patient's daughter. Hospice is involved and patient will be discharged home with hospice follow-up as  outpatient  6. 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 Appreciate Dr Karalee Height input  Continue valganciclovir orally, initially twice daily for  3weeks and then once a day indefinitely 7. Malnutrition, patient is to continue dronabinol 8. Candida esophagitis, Diflucan indefinitely  DI SCHARGE CONDITIONS:   poor  CONSULTS OBTAINED:  Treatment Team:  Clydie Braun, MD Elnita Maxwell, MD  DRUG ALLERGIES:   Allergies  Allergen Reactions  . Penicillins Hives  . Sulfa Antibiotics Nausea Only and Rash    Rash and nausea. No breathing issues    DISCHARGE MEDICATIONS:   Current Discharge Medication List    START taking these medications   Details  collagenase (SANTYL) ointment Apply topically daily. Qty: 15 g, Refills: 0    dapsone 100 MG tablet Take 1 tablet (100 mg total) by mouth daily. Qty: 30 tablet, Refills: 6    Darunavir Ethanolate (PREZISTA) 800 MG tablet Take 1 tablet (800 mg total) by mouth daily with breakfast. Qty: 30 tablet, Refills: 6    docusate sodium (COLACE) 100 MG capsule Take 1 capsule (100 mg total) by mouth 2 (two) times daily. Qty: 10 capsule, Refills: 0    dronabinol (MARINOL) 2.5 MG capsule Take 1 capsule (2.5 mg total) by mouth 2 (two) times daily before lunch and supper. Qty: 60 capsule, Refills: 3    elvitegravir-cobicistat-emtricitabine-tenofovir (GENVOYA) 150-150-200-10 MG TABS tablet Take 1 tablet by mouth daily with breakfast. Qty: 30 tablet, Refills: 6    ipratropium-albuterol (DUONEB) 0.5-2.5 (3) MG/3ML SOLN Take 3 mLs by nebulization every 4 (four) hours as needed. Qty: 360 mL, Refills: 6    oxyCODONE (OXY IR/ROXICODONE) 5 MG immediate release tablet Take 1 tablet (5 mg total) by mouth every 4 (four) hours as needed for moderate pain. Qty: 30 tablet, Refills: 0    sucralfate (CARAFATE) 1 GM/10ML suspension Take 10 mLs (1 g total) by mouth 4 (four) times daily -  with meals and at bedtime. Qty: 420 mL, Refills: 0    valganciclovir (VALCYTE) 50 MG/ML SOLR Take 18 mLs (900 mg total) by mouth daily. Qty: 1080 mL, Refills: 6      CONTINUE these medications which have  NOT CHANGED   Details  azithromycin (ZITHROMAX) 200 MG/5ML suspension Take 15 mLs by mouth once a week. Refills: 1    fluconazole (DIFLUCAN) 40 MG/ML suspension Take 200 mg by mouth daily. For 30 days, prescribed 10/20 Refills: 0    Ganciclovir (ZIRGAN) 0.15 % GEL Place 1 drop into the left eye 5 (five) times daily. Qty: 1 Tube, Refills: 1         DISCHARGE INSTRUCTIONS:    Patient is to follow-up with Dr. Sampson Goon in about 1 week  If you experience worsening of your admission symptoms, develop shortness of breath, life threatening emergency, suicidal or homicidal thoughts you must seek medical attention immediately by calling 911 or calling your MD immediately  if symptoms less severe.  You Must read complete instructions/literature along with all the possible adverse reactions/side effects for all the Medicines you take and that have been prescribed to you. Take any new Medicines after you have completely understood and accept all the possible adverse reactions/side effects.   Please note  You were cared for by a hospitalist during your hospital stay. If you have any questions about your discharge medications or the care you received while you were in the hospital after you are discharged, you can call the unit and asked to speak with the hospitalist on call  if the hospitalist that took care of you is not available. Once you are discharged, your primary care physician will handle any further medical issues. Please note that NO REFILLS for any discharge medications will be authorized once you are discharged, as it is imperative that you return to your primary care physician (or establish a relationship with a primary care physician if you do not have one) for your aftercare needs so that they can reassess your need for medications and monitor your lab values.    Today   CHIEF COMPLAINT:   Chief Complaint  Patient presents with  . Shoulder Injury    HISTORY OF PRESENT  ILLNESS:  Donna Hayden  is a 44 y.o. female with a known history of HIV/AIDS, pulmonary hypertension who presents to the hospital with complaints of fall. After a fall. She felt immediate pain in the right side maxillary area and was having shortness of breath , on arrival to emergency room, she was noted to have large right-sided pneumothorax and the chest tube was placed . Chest x-ray also revealed heterogeneous opacities in left lower lobe concerning for possible pneumonia . Further labs revealed leukopenia, anemia , hypoalbuminemia , hyponatremia, hypokalemia. Patient was complaining of odynophagia and was not able to eat or take pills.  Patient continued to have high fever to 101 to 102. Since 03/04/2015. Urinalysis performed on November 9 revealed severe pyuria. She was initiated on broad-spectrum antibiotic therapy with vancomycin and Zosyn. Blood as well as urine cultures were sent . Urine culture revealed Escherichia coli, more than 100,000 colony-forming units, sensitive to all antibiotics, except trimethoprim sulfamethoxazole and levofloxacin . Blood cultures showed no growth . Patient was seen by Dr. Sampson Goon and followed along.  Dr. Sampson Goon initiated patient on not Diflucan for suspected candida esophagitis and patient's condition improved . It was felt that patient's EGD could be postponed as outpatient. She was started on HIV medications as well. 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 She was seen by ophthalmologist who felt the patient had CMV retinitis and patient was initiated on ganciclovir intravenously initially, which was changed to oral upon discharge.   Discussion by problem 1, Right-sided traumatic pneumothorax. Removed chest tube by Dr. Sharmon Revere. Repeated chest x-ray: No pneumothorax  But chest CT showed bilateral lower lobe opacities, left side is worse than right side, now on Zithromax, completed course of Rocephin, blood cultures are negative, sputum cultures are not  obtained  2. Hyponatremia. Improved  3. Hypokalemia.Resolved on IV and by mouth potassium supplement.  4 Hypomagnesemia. Resolved with IV magnesium.  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 Patient will be discharged to home with hospice CD 4 ct 29 in Sept 2016. CD3+CD4+cells/CD3+CD8+Cells: 0.14.Dr. Sampson Goon discussed illness with patient's daughter. Hospice is involved and patient will be discharged home with hospice follow-up as outpatient  6. 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 Appreciate Dr Karalee Height input  Continue valganciclovir orally, initially twice daily for 3weeks and then once a day indefinitely 7. Malnutrition, patient is to continue dronabinol 8. Candida esophagitis, Diflucan indefinitely    VITAL SIGNS:  Blood pressure 103/62, pulse 85, temperature 97.6 F (36.4 C), temperature source Oral, resp. rate 18, height 5' (1.524 m), weight 25.401 kg (56 lb), SpO2 95 %.  I/O:   Intake/Output Summary (Last 24 hours) at 03/12/15 1348 Last data filed at 03/12/15 0800  Gross per 24 hour  Intake    240 ml  Output   1000 ml  Net   -760 ml    PHYSICAL EXAMINATION:  GENERAL:  44 y.o.-year-old patient lying in the bed with no acute distress.  Severely malnourished EYES: Pupils equal, round, reactive to light and accommodation. No scleral icterus. Extraocular muscles intact.  HEENT: Head atraumatic, normocephalic. Oropharynx and nasopharynx clear.  NECK:  Supple, no jugular venous distention. No thyroid enlargement, no tenderness.  LUNGS: Normal breath sounds bilaterally, no wheezing, rales,rhonchi or crepitation. No use of accessory muscles of respiration.  CARDIOVASCULAR: S1, S2 normal. No murmurs, rubs, or gallops.  ABDOMEN: Soft, non-tender, non-distended. Bowel sounds present. No organomegaly or  mass.  EXTREMITIES: No pedal edema, cyanosis, or clubbing.  NEUROLOGIC: Cranial nerves II through XII are intact. Muscle strength 5/5 in all extremities. Sensation intact. Gait not checked.  PSYCHIATRIC: The patient is alert and oriented x 3.  SKIN: No obvious rash, lesion, or ulcer.   DATA REVIEW:   CBC  Recent Labs Lab 03/12/15 0530  WBC 3.1*  HGB 8.0*  HCT 24.9*  PLT 168    Chemistries   Recent Labs Lab 03/08/15 0516  NA 133*  K 3.7  CL 109  CO2 21*  GLUCOSE 115*  BUN <5*  CREATININE 0.30*  CALCIUM 7.4*  MG 1.9    Cardiac Enzymes No results for input(s): TROPONINI in the last 168 hours.  Microbiology Results  Results for orders placed or performed during the hospital encounter of 03/01/15  Culture, blood (routine x 2)     Status: None   Collection Time: 03/06/15 11:14 AM  Result Value Ref Range Status   Specimen Description BLOOD RIGHT ARM  Final   Special Requests   Final    BOTTLES DRAWN AEROBIC AND ANAEROBIC  4CC AERO 5CC ANAERO   Culture NO GROWTH 5 DAYS  Final   Report Status 03/11/2015 FINAL  Final  Culture, blood (routine x 2)     Status: None   Collection Time: 03/06/15 11:22 AM  Result Value Ref Range Status   Specimen Description BLOOD LEFT HAND  Final   Special Requests   Final    BOTTLES DRAWN AEROBIC AND ANAEROBIC 8CC AEROBIC,4CC ANAEROBIC   Culture NO GROWTH 5 DAYS  Final   Report Status 03/11/2015 FINAL  Final  Urine culture     Status: None   Collection Time: 03/06/15 12:24 PM  Result Value Ref Range Status   Specimen Description URINE, CATHETERIZED  Final   Special Requests NONE  Final   Culture >=100,000 COLONIES/mL ESCHERICHIA COLI  Final   Report Status 03/08/2015 FINAL  Final   Organism ID, Bacteria ESCHERICHIA COLI  Final      Susceptibility   Escherichia coli - MIC*    AMPICILLIN 8 SENSITIVE Sensitive     CEFTAZIDIME <=1 SENSITIVE Sensitive     CEFAZOLIN <=4 SENSITIVE Sensitive     CEFTRIAXONE <=1 SENSITIVE Sensitive      CIPROFLOXACIN <=0.25 SENSITIVE Sensitive     GENTAMICIN <=1 SENSITIVE Sensitive     IMIPENEM <=0.25 SENSITIVE Sensitive     TRIMETH/SULFA >=320 RESISTANT Resistant     NITROFURANTOIN Value in next row Sensitive      SENSITIVE<=16    PIP/TAZO Value in next row Sensitive      SENSITIVE8    LEVOFLOXACIN Value in next row Resistant      RESISTANT>=8    * >=100,000 COLONIES/mL ESCHERICHIA COLI    RADIOLOGY:  No results found.  EKG:  No orders found for this or any previous visit.    Management plans discussed with the patient, family and they are in agreement.  CODE STATUS:     Code Status Orders        Start     Ordered   03/01/15 2344  Full code   Continuous     03/01/15 2344      TOTAL TIME TAKING CARE OF THIS PATIENT:  40 minutes.    Katharina Caper M.D on 03/12/2015 at 1:48 PM  Between 7am to 6pm - Pager - [phone]  After 6pm go to [email or web address] - password [email or web address] [facility]  [place] [facility]  Office  [phone]  CC: Primary care physician; WOODS, Garnette Czech, MD

## 2015-03-14 LAB — CMV (CYTOMEGALOVIRUS) DNA ULTRAQUANT, PCR: LOG10 CMV QN DNA PL: UNDETERMINED {Log_IU}/mL

## 2015-03-15 LAB — HERPES SIMPLEX VIRUS(HSV) DNA BY PCR
HSV 1 DNA: POSITIVE — AB
HSV 2 DNA: POSITIVE — AB

## 2015-03-17 LAB — VARICELLA-ZOSTER BY PCR: VARICELLA-ZOSTER, PCR: NEGATIVE

## 2015-04-28 DEATH — deceased

## 2015-12-11 IMAGING — CR DG LUMBAR SPINE 2-3V
1 series · 3 of 3 positions shown · non-contrast
Comparison: None.

CLINICAL DATA: Bilateral lower extremity paresthesias for 3 weeks.

EXAM:
LUMBAR SPINE - 2-3 VIEW

[Series 1: dg lumbar spine 2-3 views · 0.14mm/px · 3 of 3 slices shown]
[im 1/3]
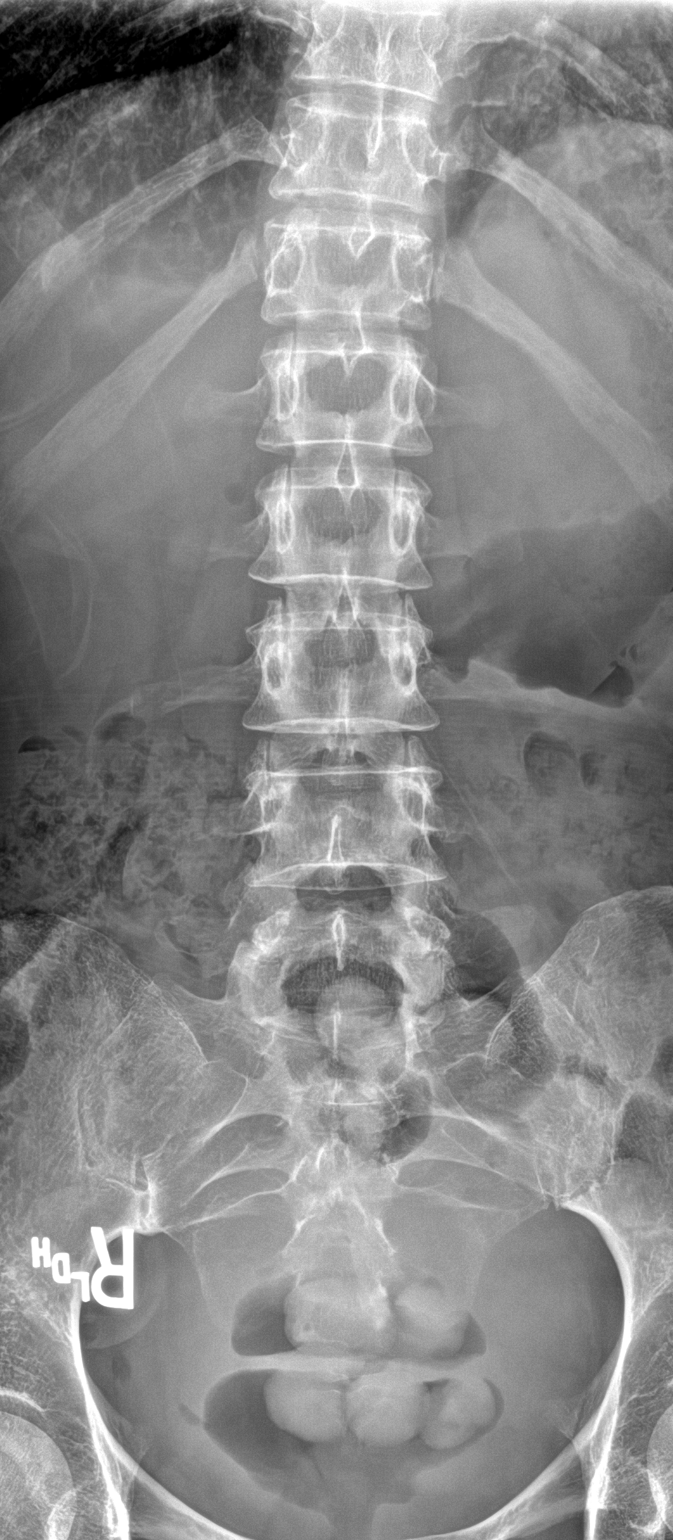
[im 2/3]
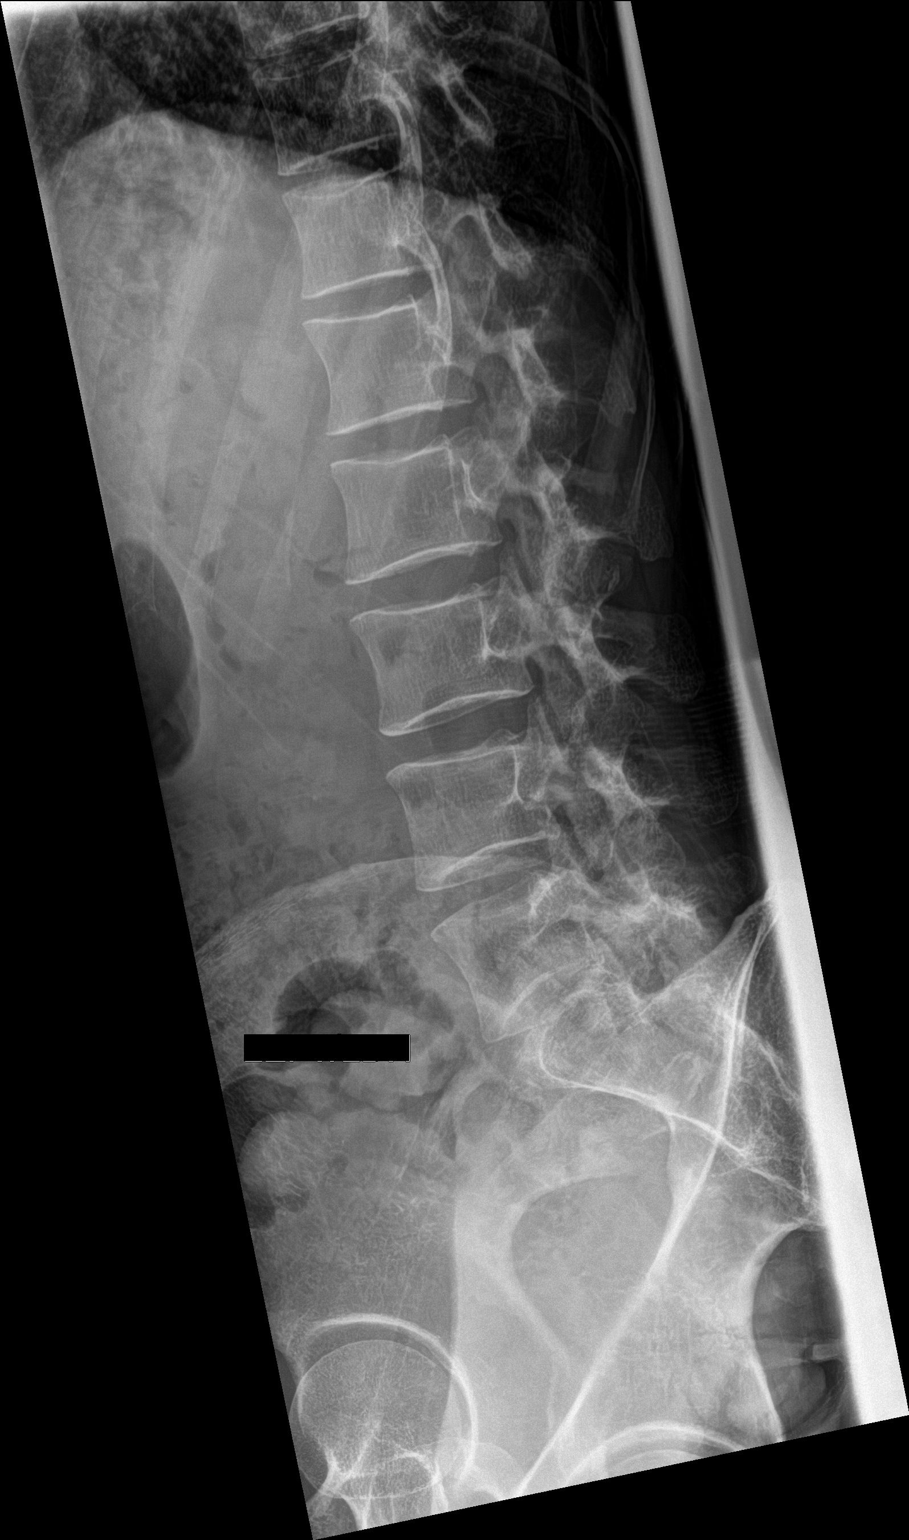
[im 3/3]
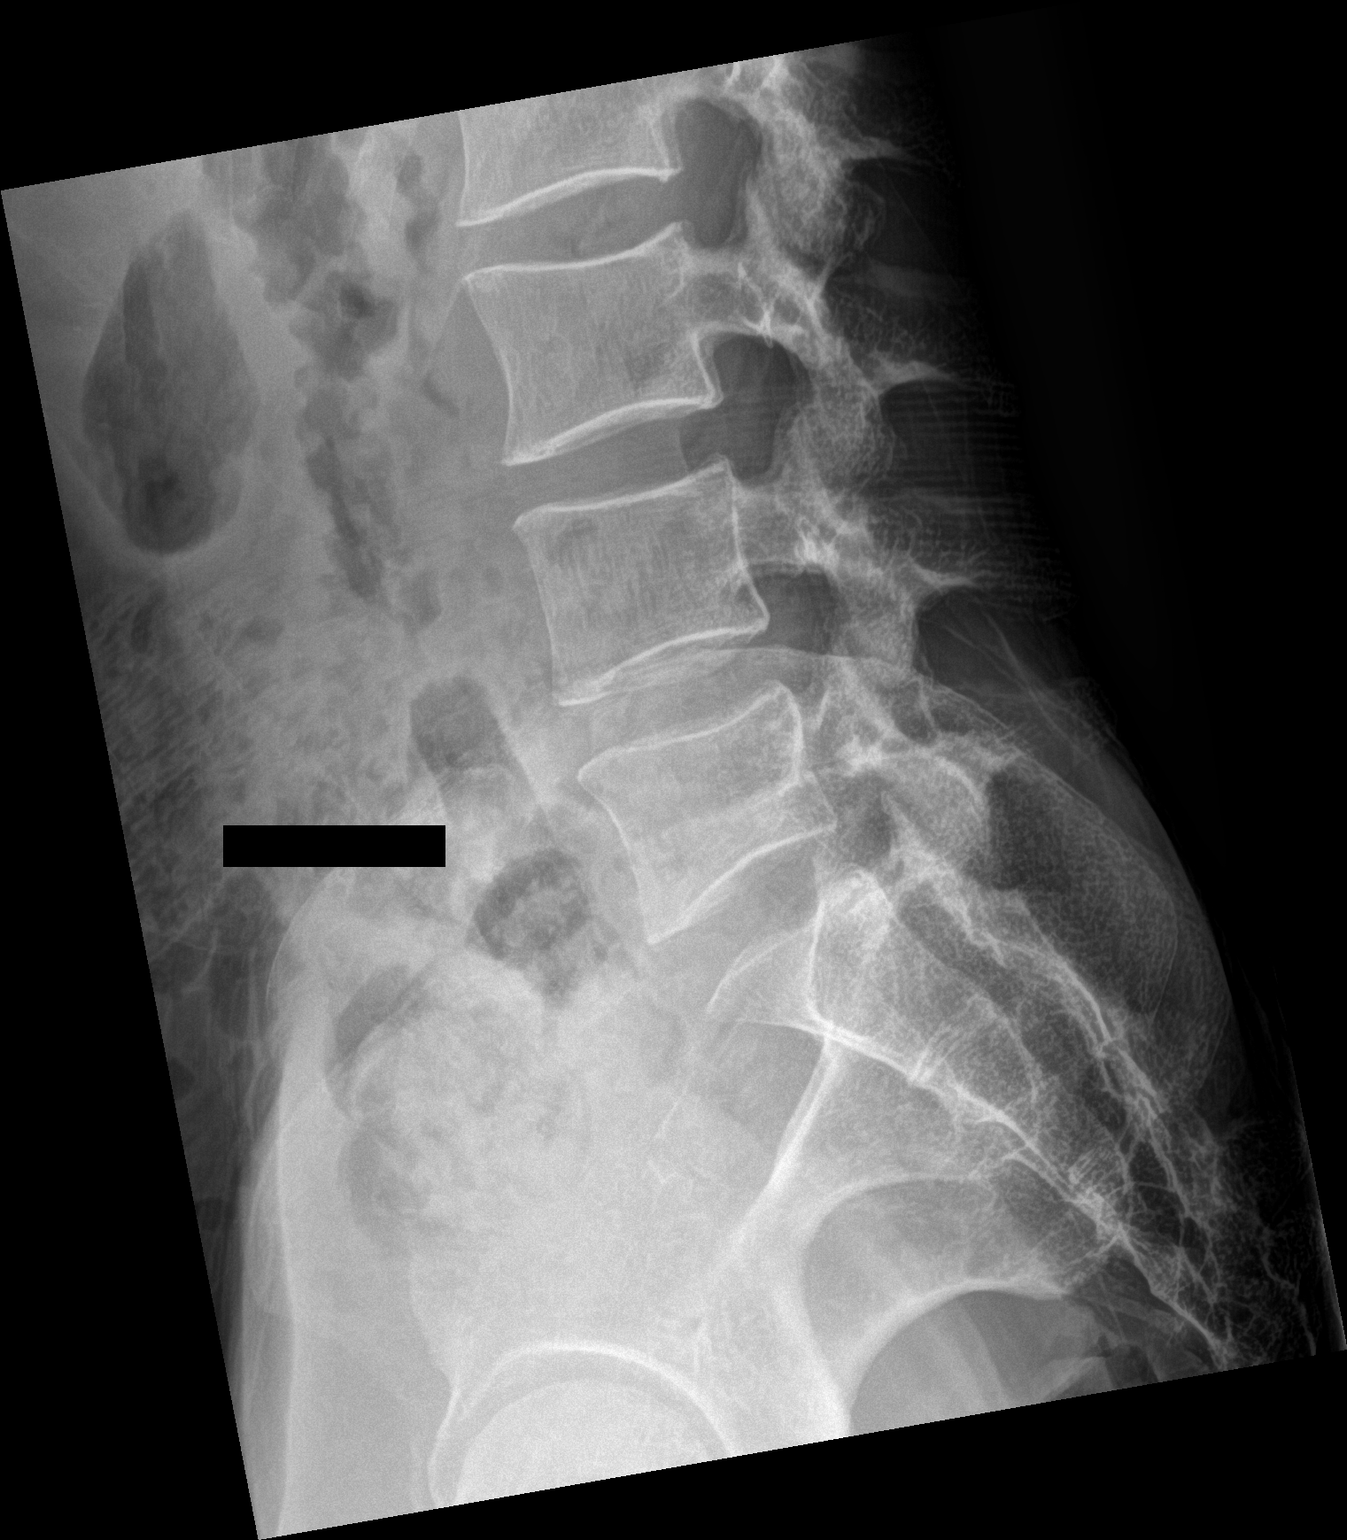

[3 of 3 positions shown; findings below may reference images not displayed]

FINDINGS: Mild straightening of normal lordosis, alignment is otherwise
maintained. Vertebral body heights are normal. There is no
listhesis. The posterior elements are intact. Disc spaces are
preserved. No fracture. Mild facet hypertrophy in the mid lower
lumbar spine. Sacroiliac joints are symmetric and normal.
IMPRESSION: 1. No acute bony abnormality. Mild straightening of normal lordosis,
can be seen in the setting of muscle spasm.
2. Mild facet hypertrophy in the mid and lower lumbar spine.

## 2016-01-18 IMAGING — CR DG HIP (WITH OR WITHOUT PELVIS) 1V PORT*L*
1 series · 3 of 3 positions shown · non-contrast
Comparison: None.

CLINICAL DATA: Left hip pain following a fall today.

EXAM:
DG HIP (WITH OR WITHOUT PELVIS) 1V PORT LEFT

[Series 1: ap · 0.17mm/px · 3 of 3 slices shown]
[im 1/3]
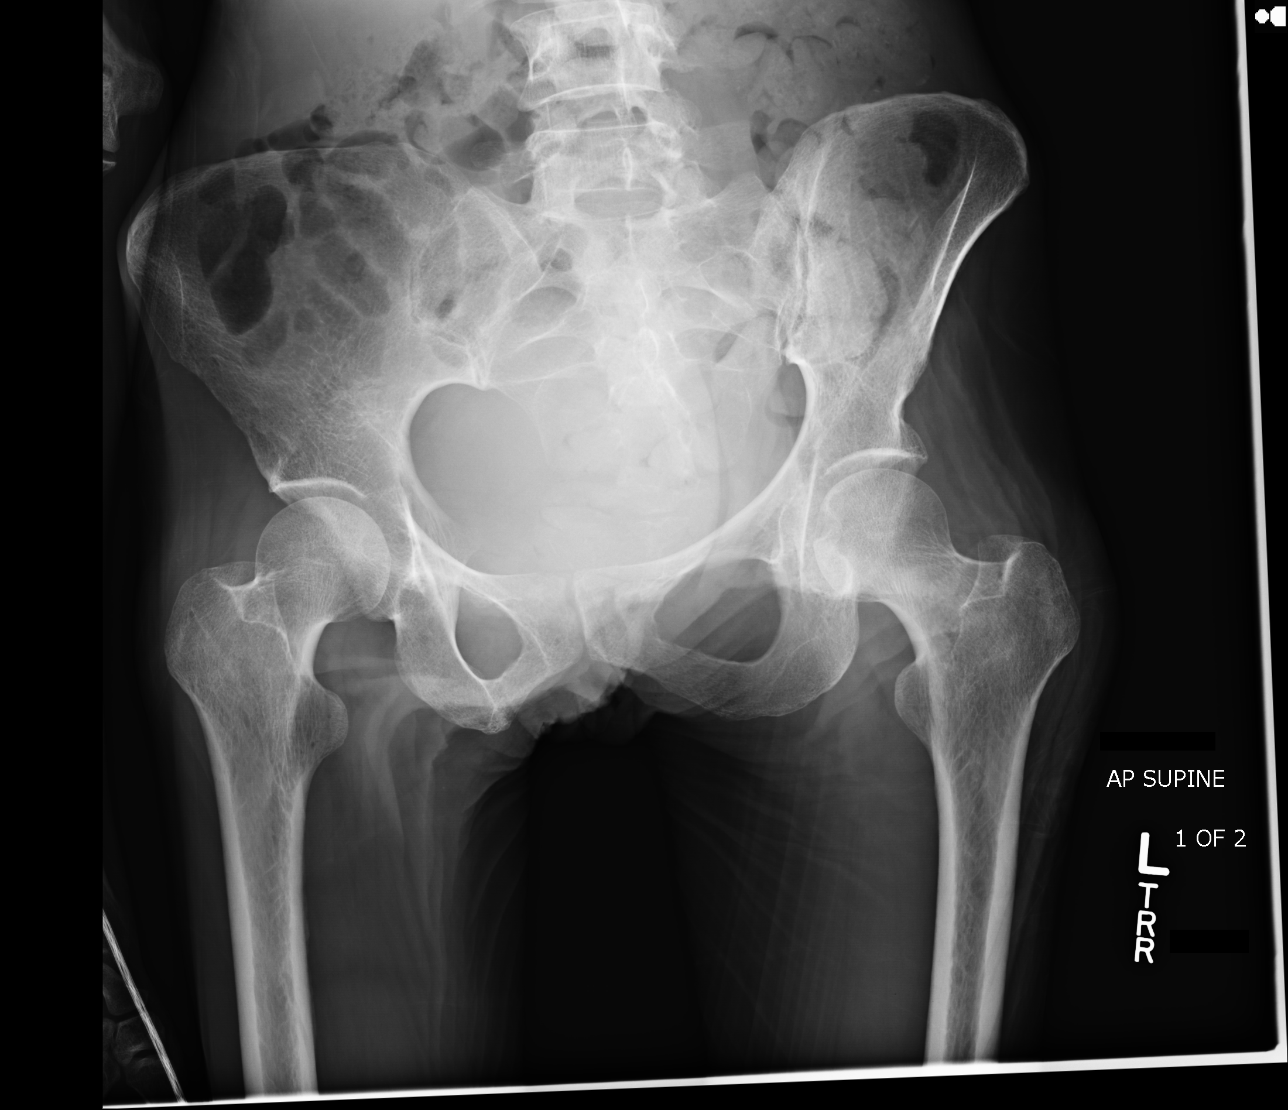
[im 2/3]
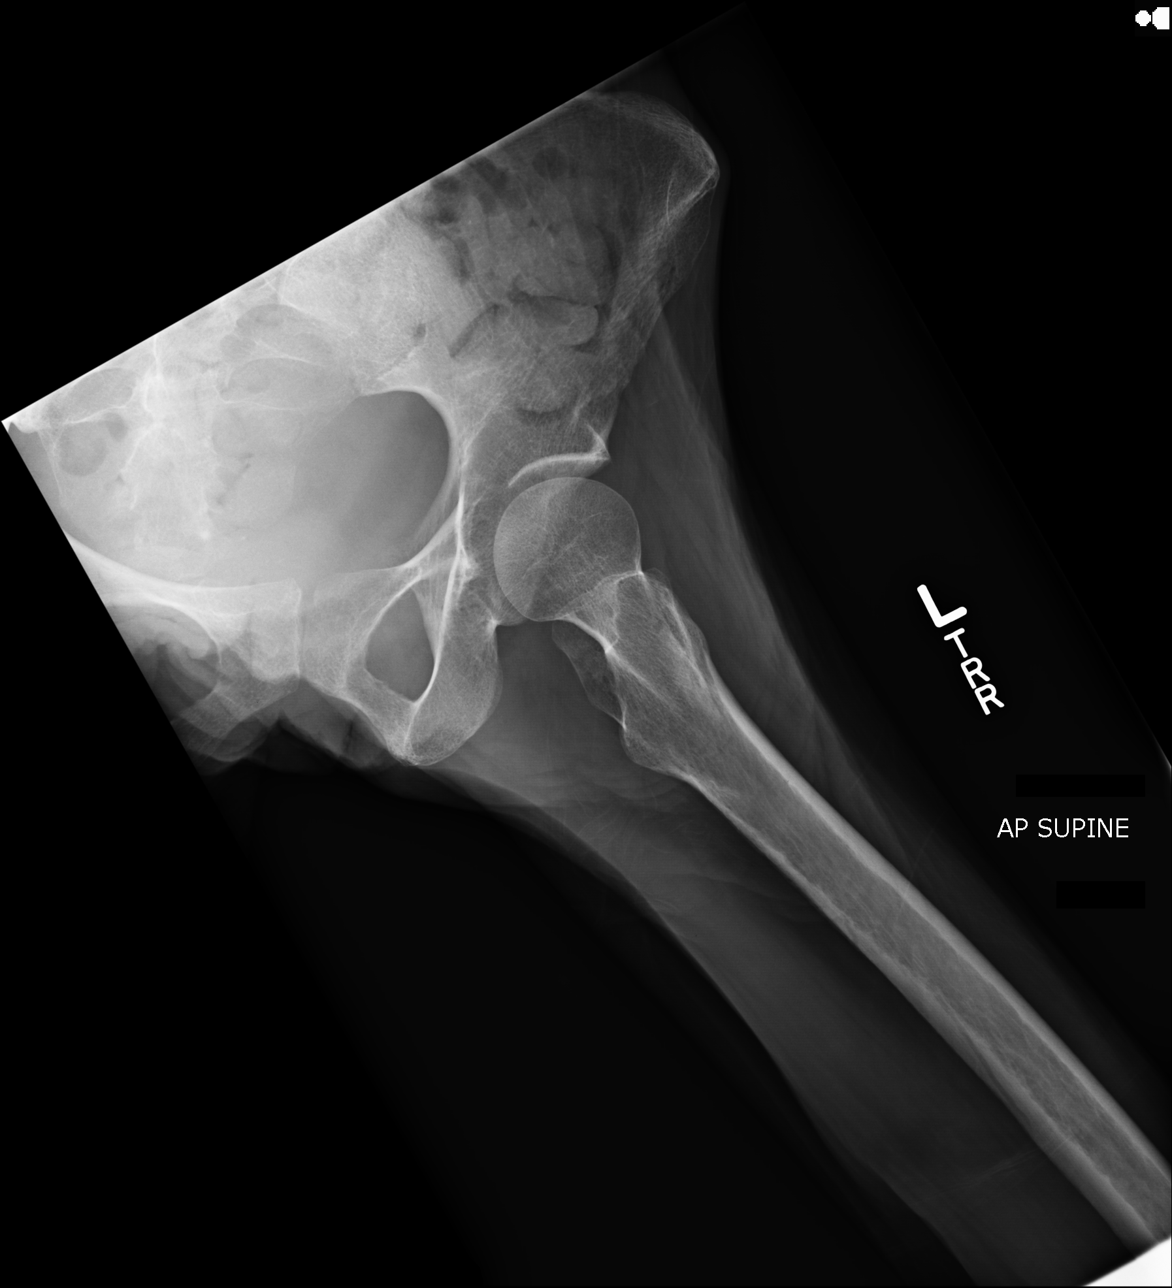
[im 3/3]
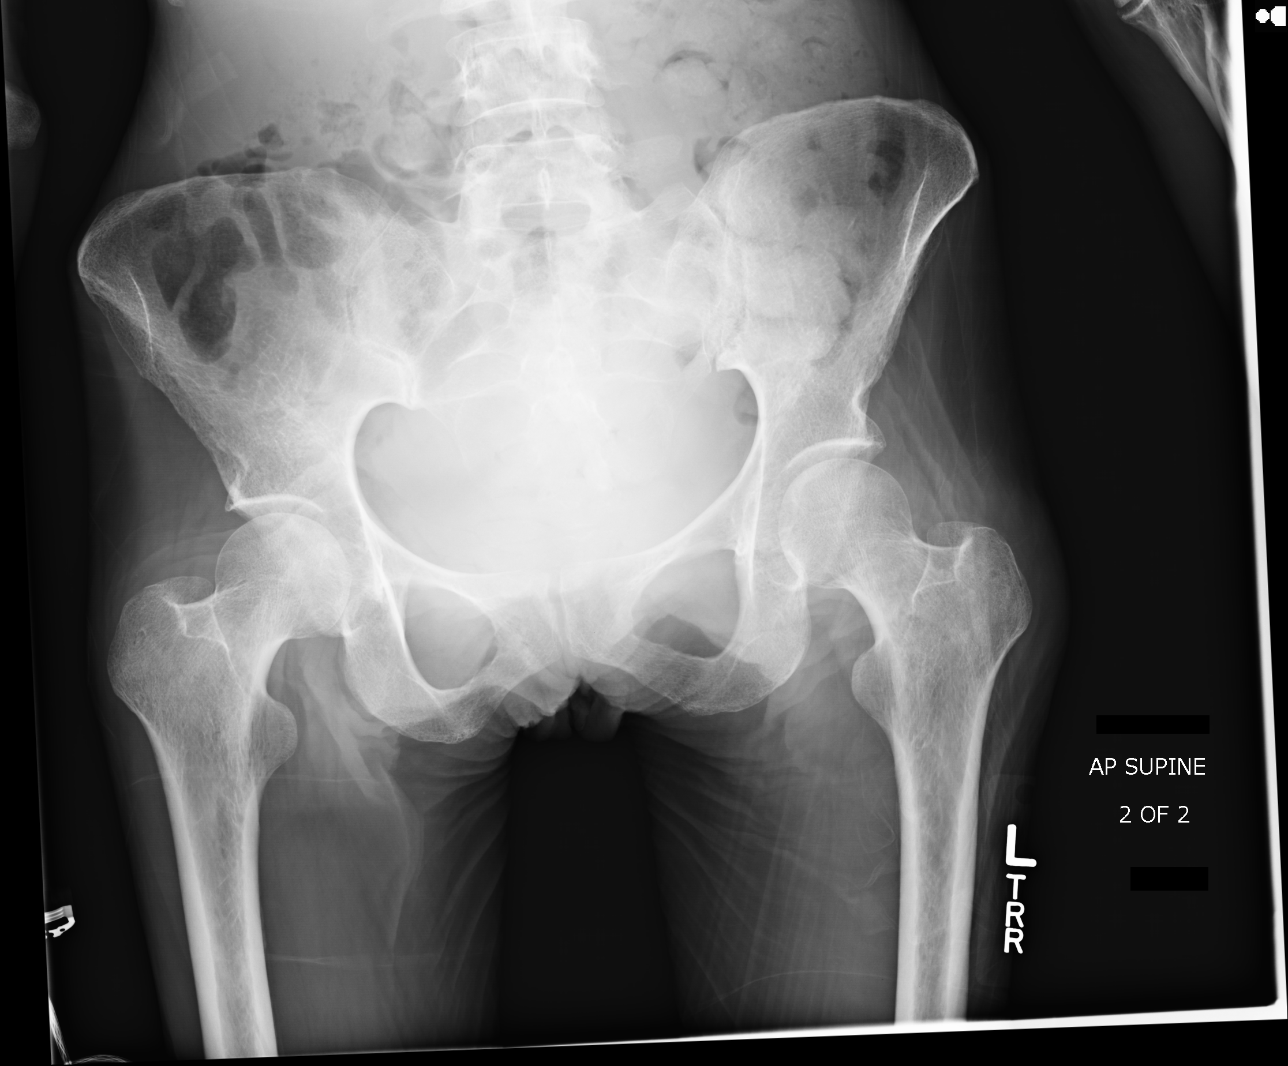

[3 of 3 positions shown; findings below may reference images not displayed]

FINDINGS: There is no evidence of hip fracture or dislocation. There is no
evidence of arthropathy or other focal bone abnormality.
IMPRESSION: Negative.

## 2016-01-18 IMAGING — CR DG SHOULDER 2+V*R*
1 series · 3 of 3 positions shown · non-contrast
Comparison: None.

CLINICAL DATA: Right shoulder pain after fall.

EXAM:
RIGHT SHOULDER - 2+ VIEW

[Series 1: dg shoulder right · 0.14mm/px · 3 of 3 slices shown]
[im 1/3]
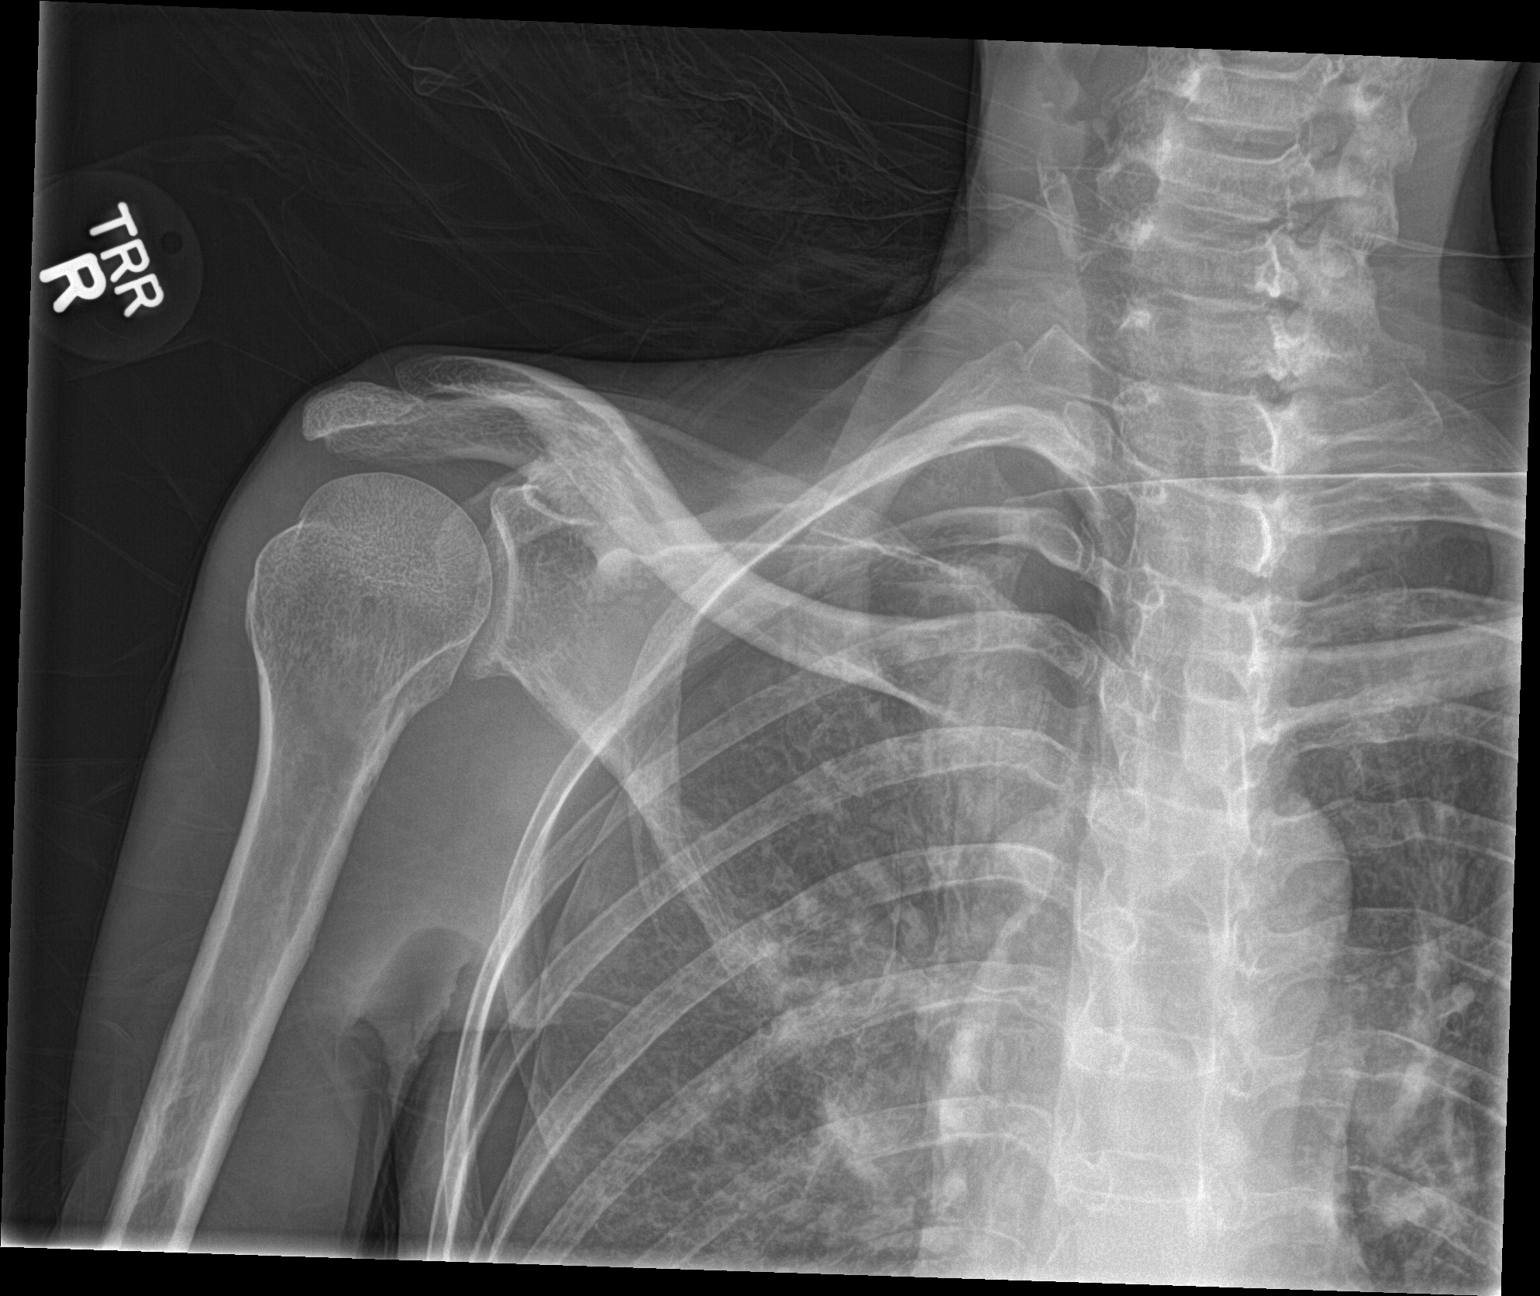
[im 2/3]
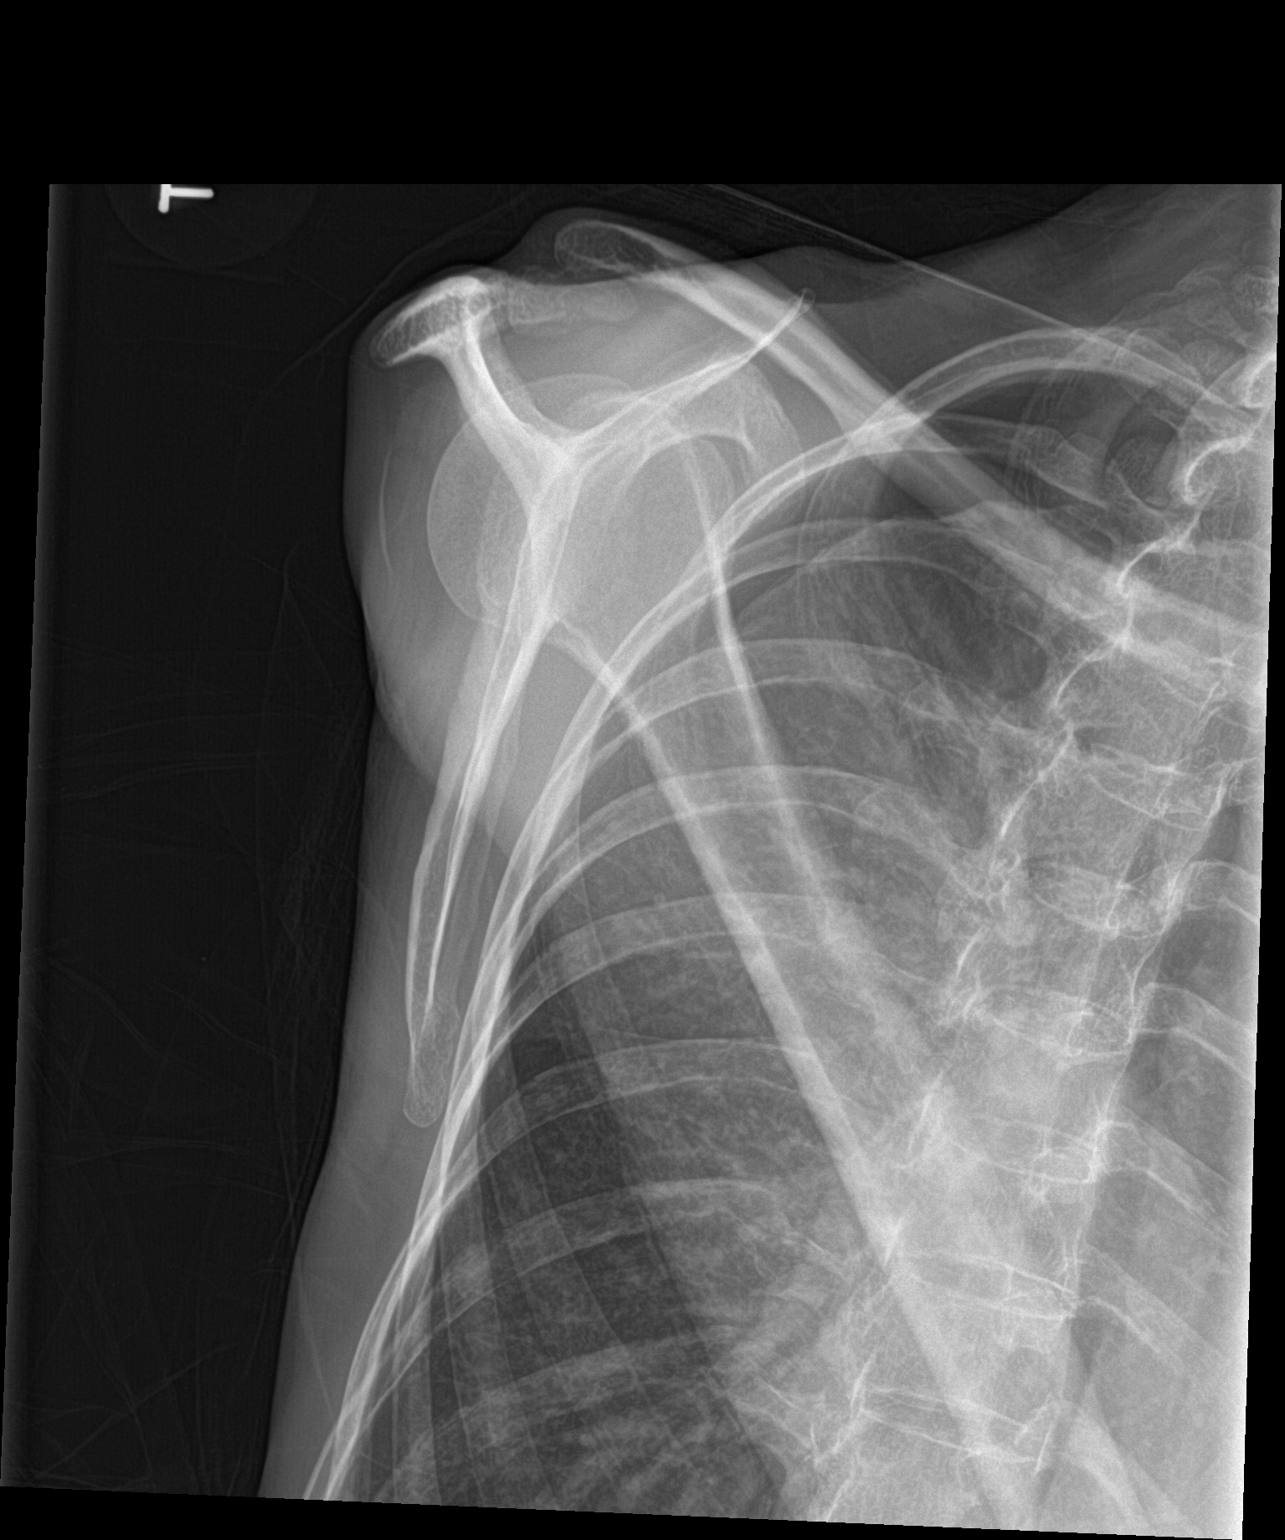
[im 3/3]
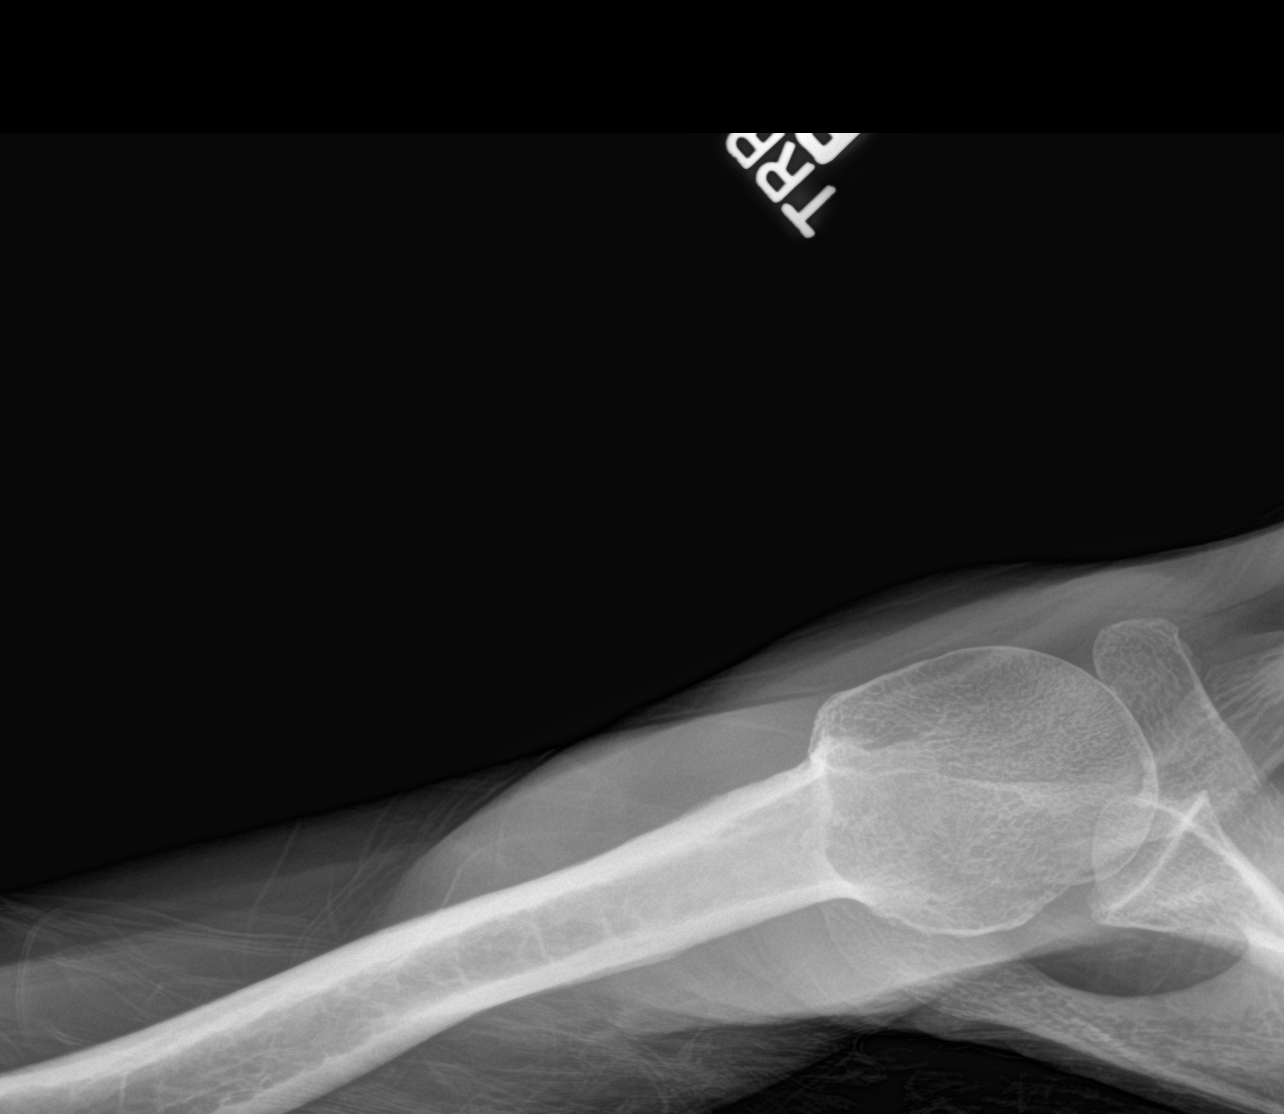

[3 of 3 positions shown; findings below may reference images not displayed]

FINDINGS: There is no evidence of fracture or dislocation. There is no
evidence of arthropathy or other focal bone abnormality. Soft
tissues are unremarkable. There is a right sided pneumothorax
identified. This measures approximately 15%. No displaced rib
fractures identified.
IMPRESSION: 1. No evidence for shoulder dislocation or fracture.
2. Right-sided pneumothorax. Recommend further investigation with PA
and lateral chest radiograph.
Critical Value/emergent results were called by telephone at the time
of interpretation on 03/01/2015 at [DATE] to Dr. JOSSUE CHESLEY , who
verbally acknowledged these results.

## 2016-01-18 IMAGING — CR DG CHEST 1V PORT
1 series · 1 of 1 positions shown · non-contrast
Comparison: 01/22/2015

CLINICAL DATA: Right chest pain after fall today

EXAM:
PORTABLE CHEST 1 VIEW

[portable]
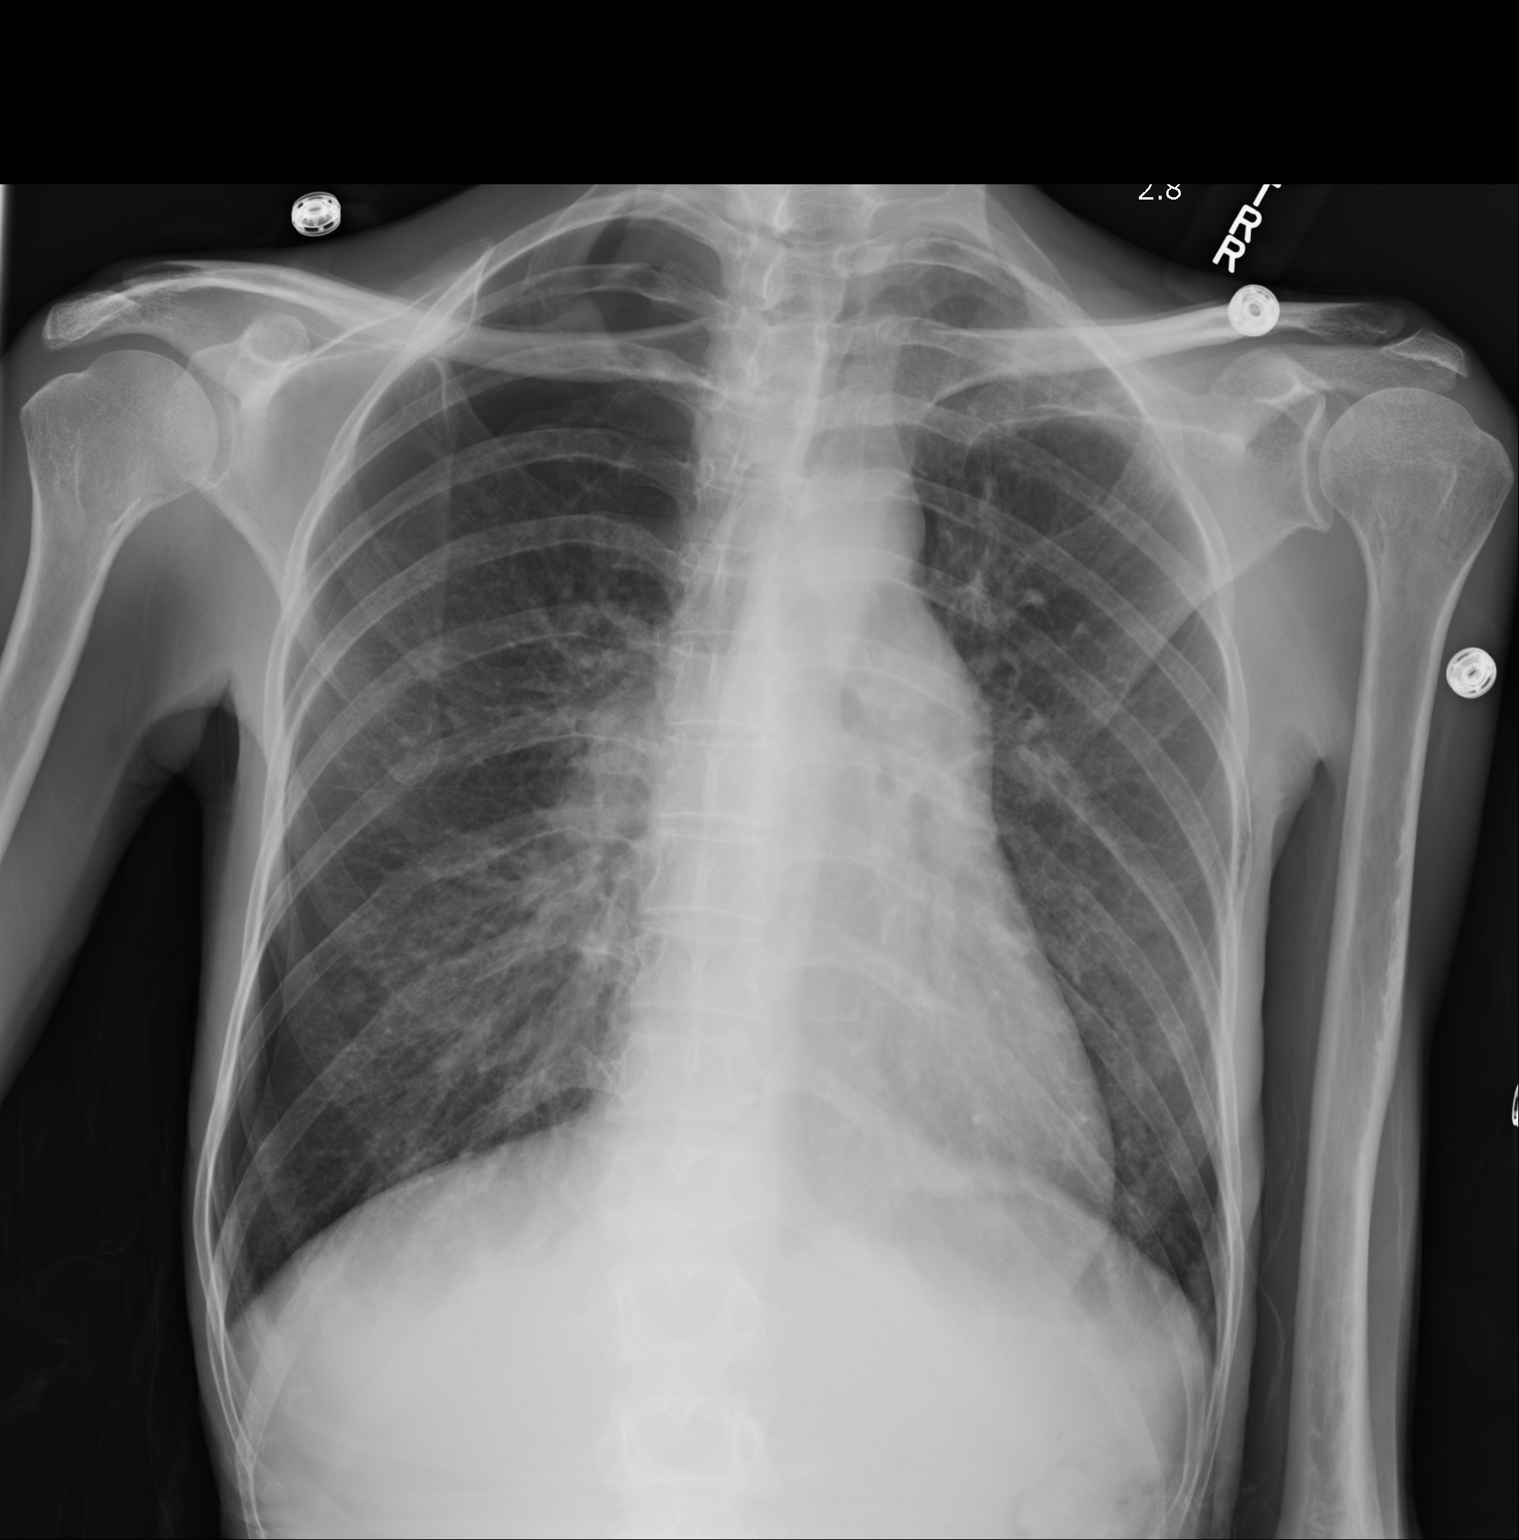

[1 of 1 positions shown; findings below may reference images not displayed]

FINDINGS: There is a large right pneumothorax, approximately 30% by volume.
Left lung is well expanded and clear. Hilar and mediastinal contours
are unremarkable. No displaced rib fractures are evident.
IMPRESSION: Large right pneumothorax. These results were called by telephone at
the time of interpretation on 03/01/2015 at [DATE] to Dr. Heber, who
verbally acknowledged these results.

## 2016-01-18 IMAGING — CR DG CHEST 1V PORT
1 series · 1 of 1 positions shown · non-contrast
Comparison: 03/01/2015 at 7121 hours

CLINICAL DATA: Status post right chest tube placement for
pneumothorax.

EXAM:
PORTABLE CHEST 1 VIEW

[portable]
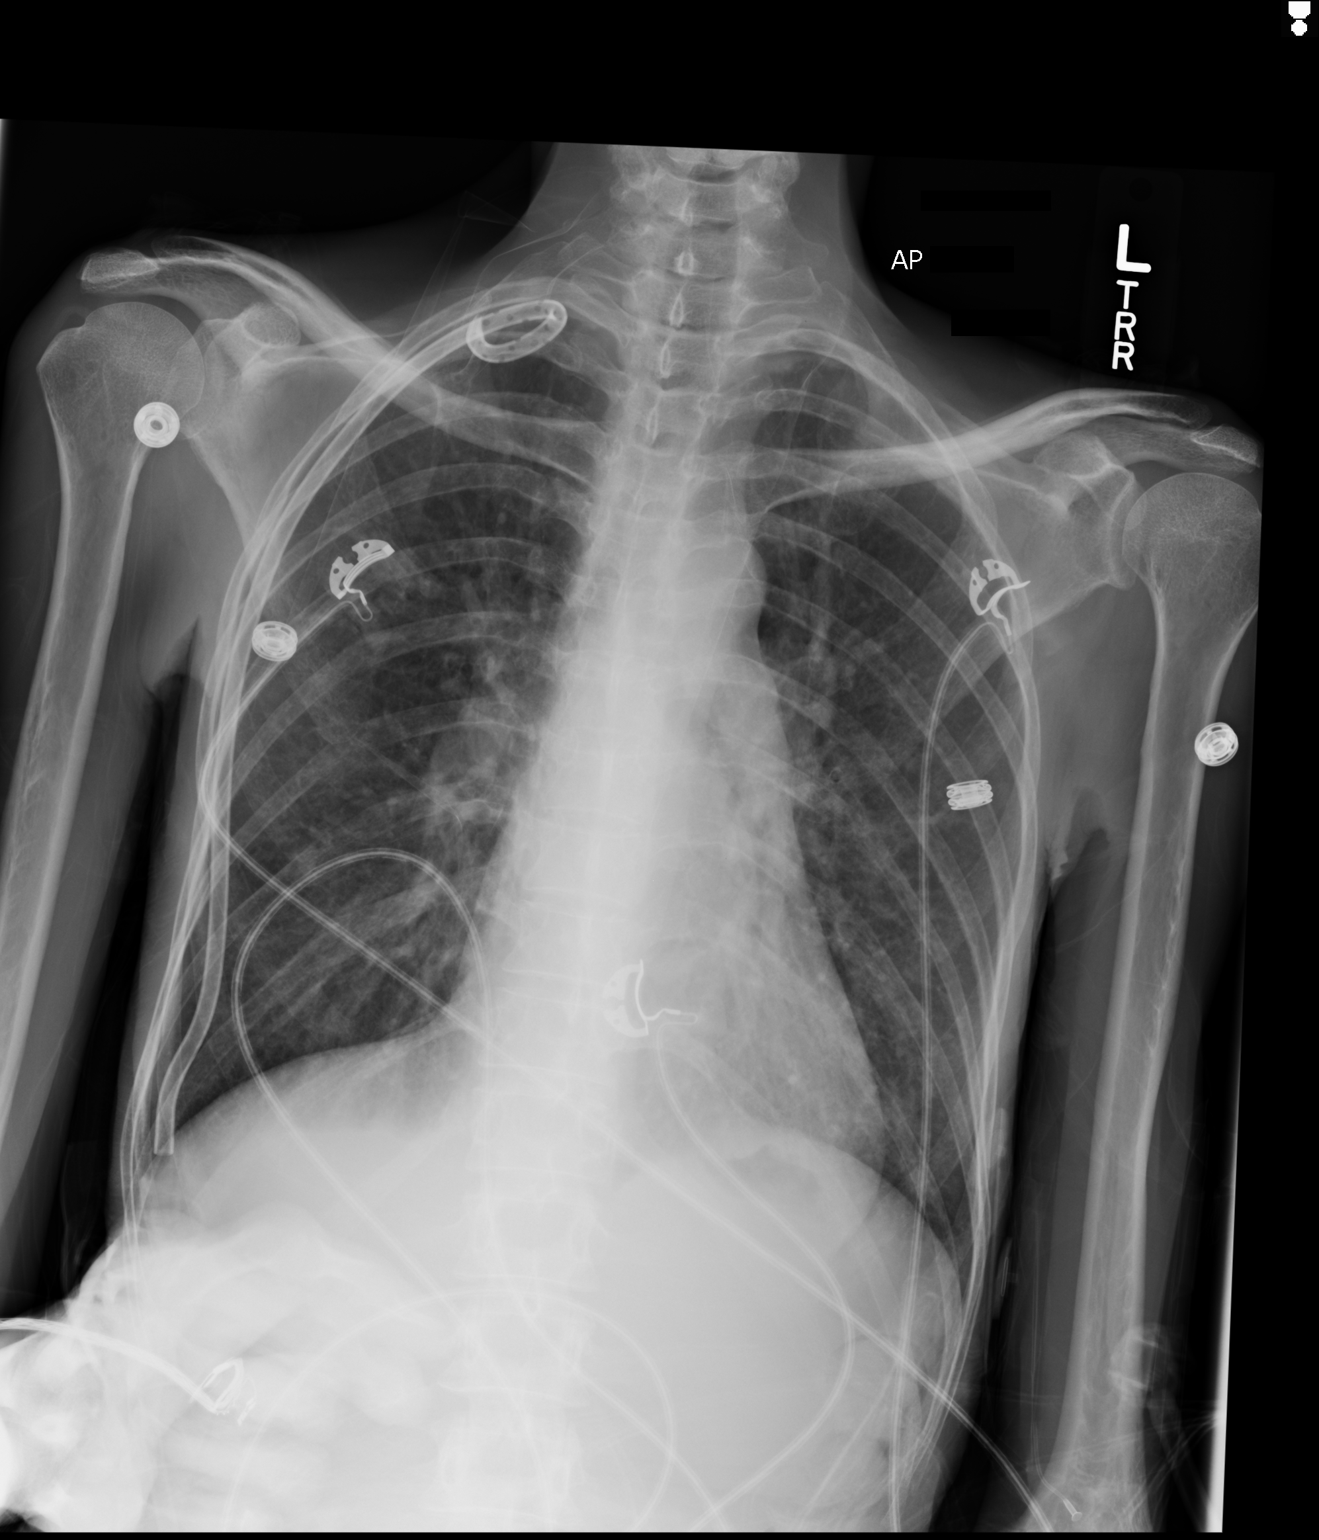

[1 of 1 positions shown; findings below may reference images not displayed]

FINDINGS: Pigtail chest tube has a placed on the right. The tip curls at the
right apex. The pneumothorax has been partly evacuated. A small
pneumothorax persists along the right lateral mid to upper lung.

Lungs are clear.  No pleural effusion.  No left pneumothorax.
IMPRESSION: 1. Status post right-sided chest tube placement with significant
re-expansion of the right lung. There is a small residual
pneumothorax along the right lateral mid to upper hemi thorax.

## 2016-01-19 IMAGING — CR DG CHEST 1V PORT
1 series · 1 of 1 positions shown · non-contrast
Comparison: 03/01/2015

CLINICAL DATA: Followup pneumothorax

EXAM:
PORTABLE CHEST 1 VIEW

[ap]
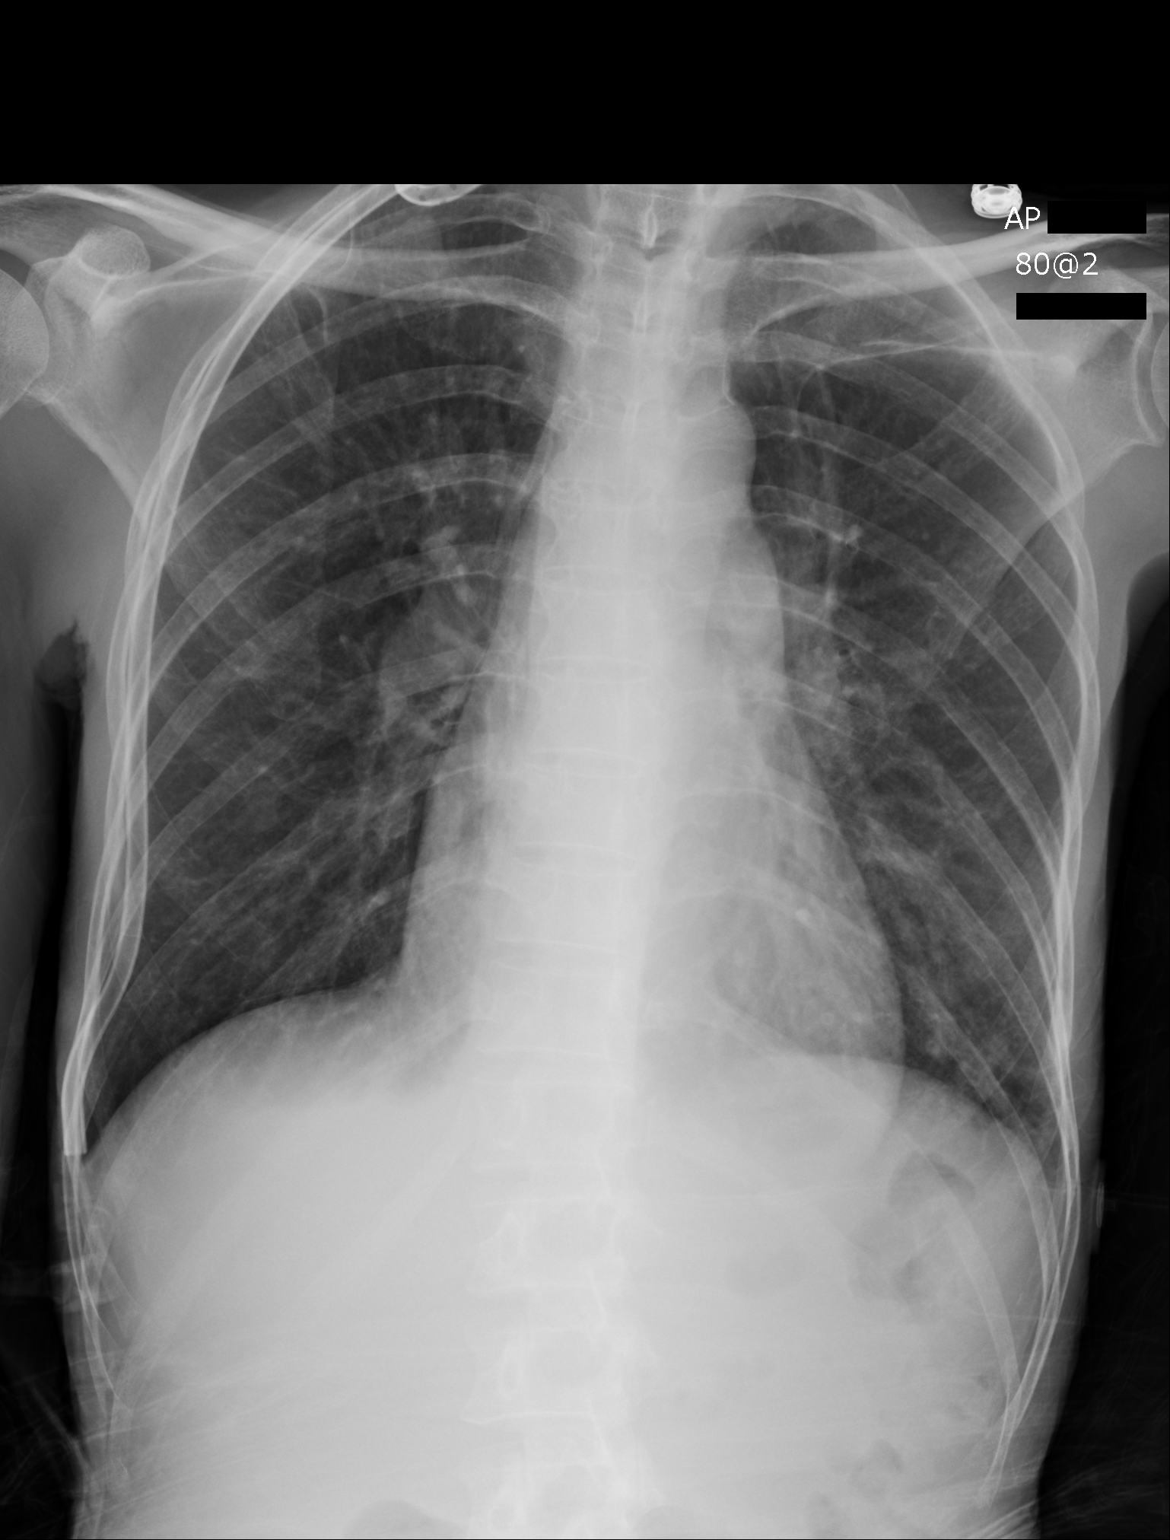

[1 of 1 positions shown; findings below may reference images not displayed]

FINDINGS: Stable position of right chest tube. No significant pneumothorax
visible. Heart size is normal. No pleural effusion or edema. No
airspace consolidation.
IMPRESSION: 1. Right-sided chest tube in place with no visible pneumothorax
identified at this time.

## 2016-01-20 IMAGING — CR DG CHEST 1V PORT
1 series · 1 of 1 positions shown · non-contrast
Comparison: Chest x-ray 03/02/2015.

CLINICAL DATA: 44-year-old female with history of pneumothorax.

EXAM:
PORTABLE CHEST 1 VIEW

[portable]
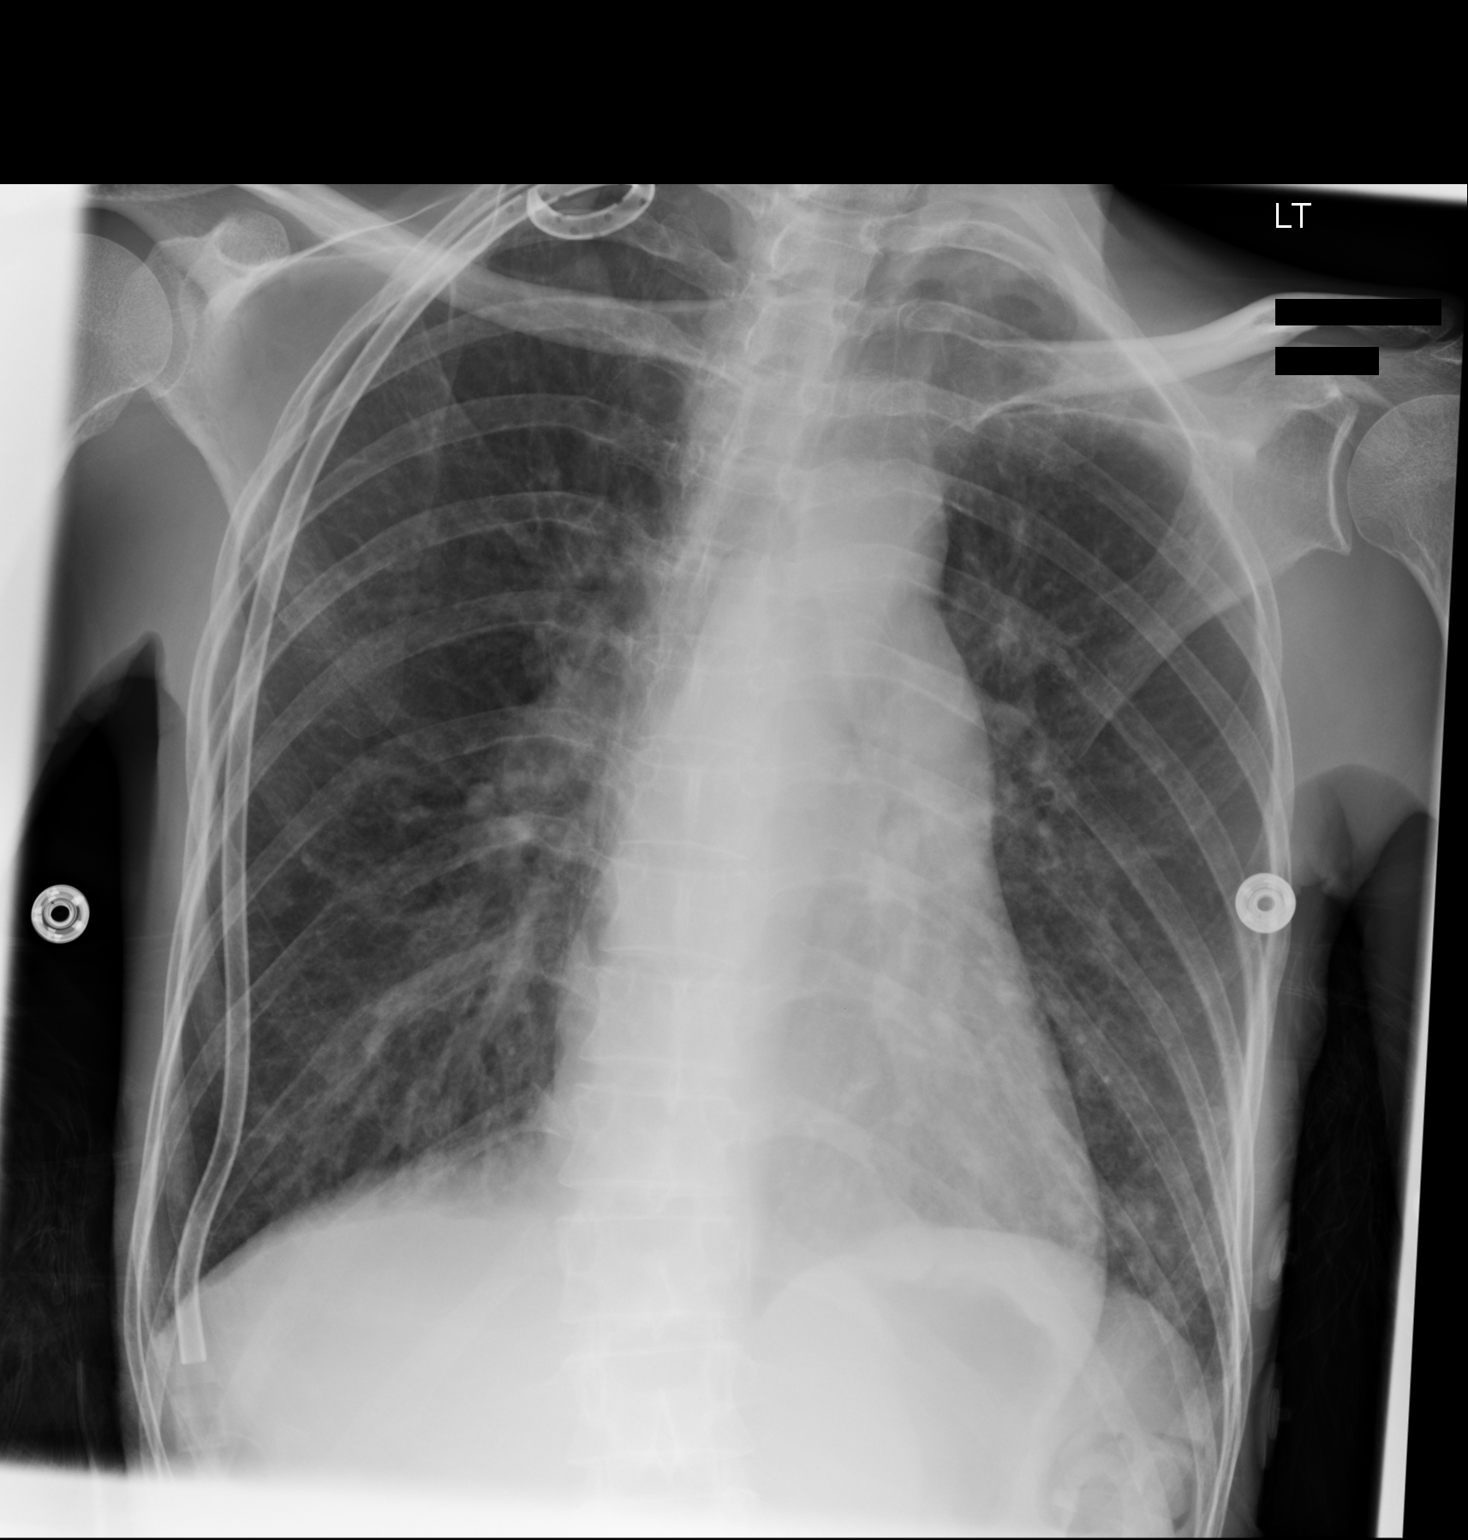

[1 of 1 positions shown; findings below may reference images not displayed]

FINDINGS: Right-sided small bore chest tube with pigtail in the apex of the
right upper lobe. Tiny lucency adjacent to the tip of the catheter
likely reflects a tiny residual apical pneumothorax (less than 2% of
the volume of the right hemithorax). Diffuse peribronchial cuffing,
similar prior examinations. More pronounced peribronchovascular
opacities in the left lower lobe may suggest a developing
bronchopneumonia. No pleural effusions. No evidence of pulmonary
edema. Heart size is normal. The patient is rotated to the left on
today's exam, resulting in distortion of the mediastinal contours
and reduced diagnostic sensitivity and specificity for mediastinal
pathology.
IMPRESSION: 1. Right-sided chest tube is stable in position. There is only a
trace residual right apical pneumothorax noted on today's
examination.
2. Diffuse bronchial wall thickening similar to recent prior
examinations, suggestive of bronchitis. More severe
peribronchovascular opacities in the left lower lobe may reflect a
developing bronchopneumonia.

## 2016-01-21 IMAGING — CR DG CHEST 2V
1 series · 2 of 2 positions shown · non-contrast
Comparison: Portable chest x-ray March 03, 2015

CLINICAL DATA: Follow-up of pneumo thorax, persistent slight cough
and congestion.

EXAM:
CHEST  2 VIEW

[Series 1: dg chest 2 view · 0.14mm/px · 2 of 2 slices shown]
[im 1/2]
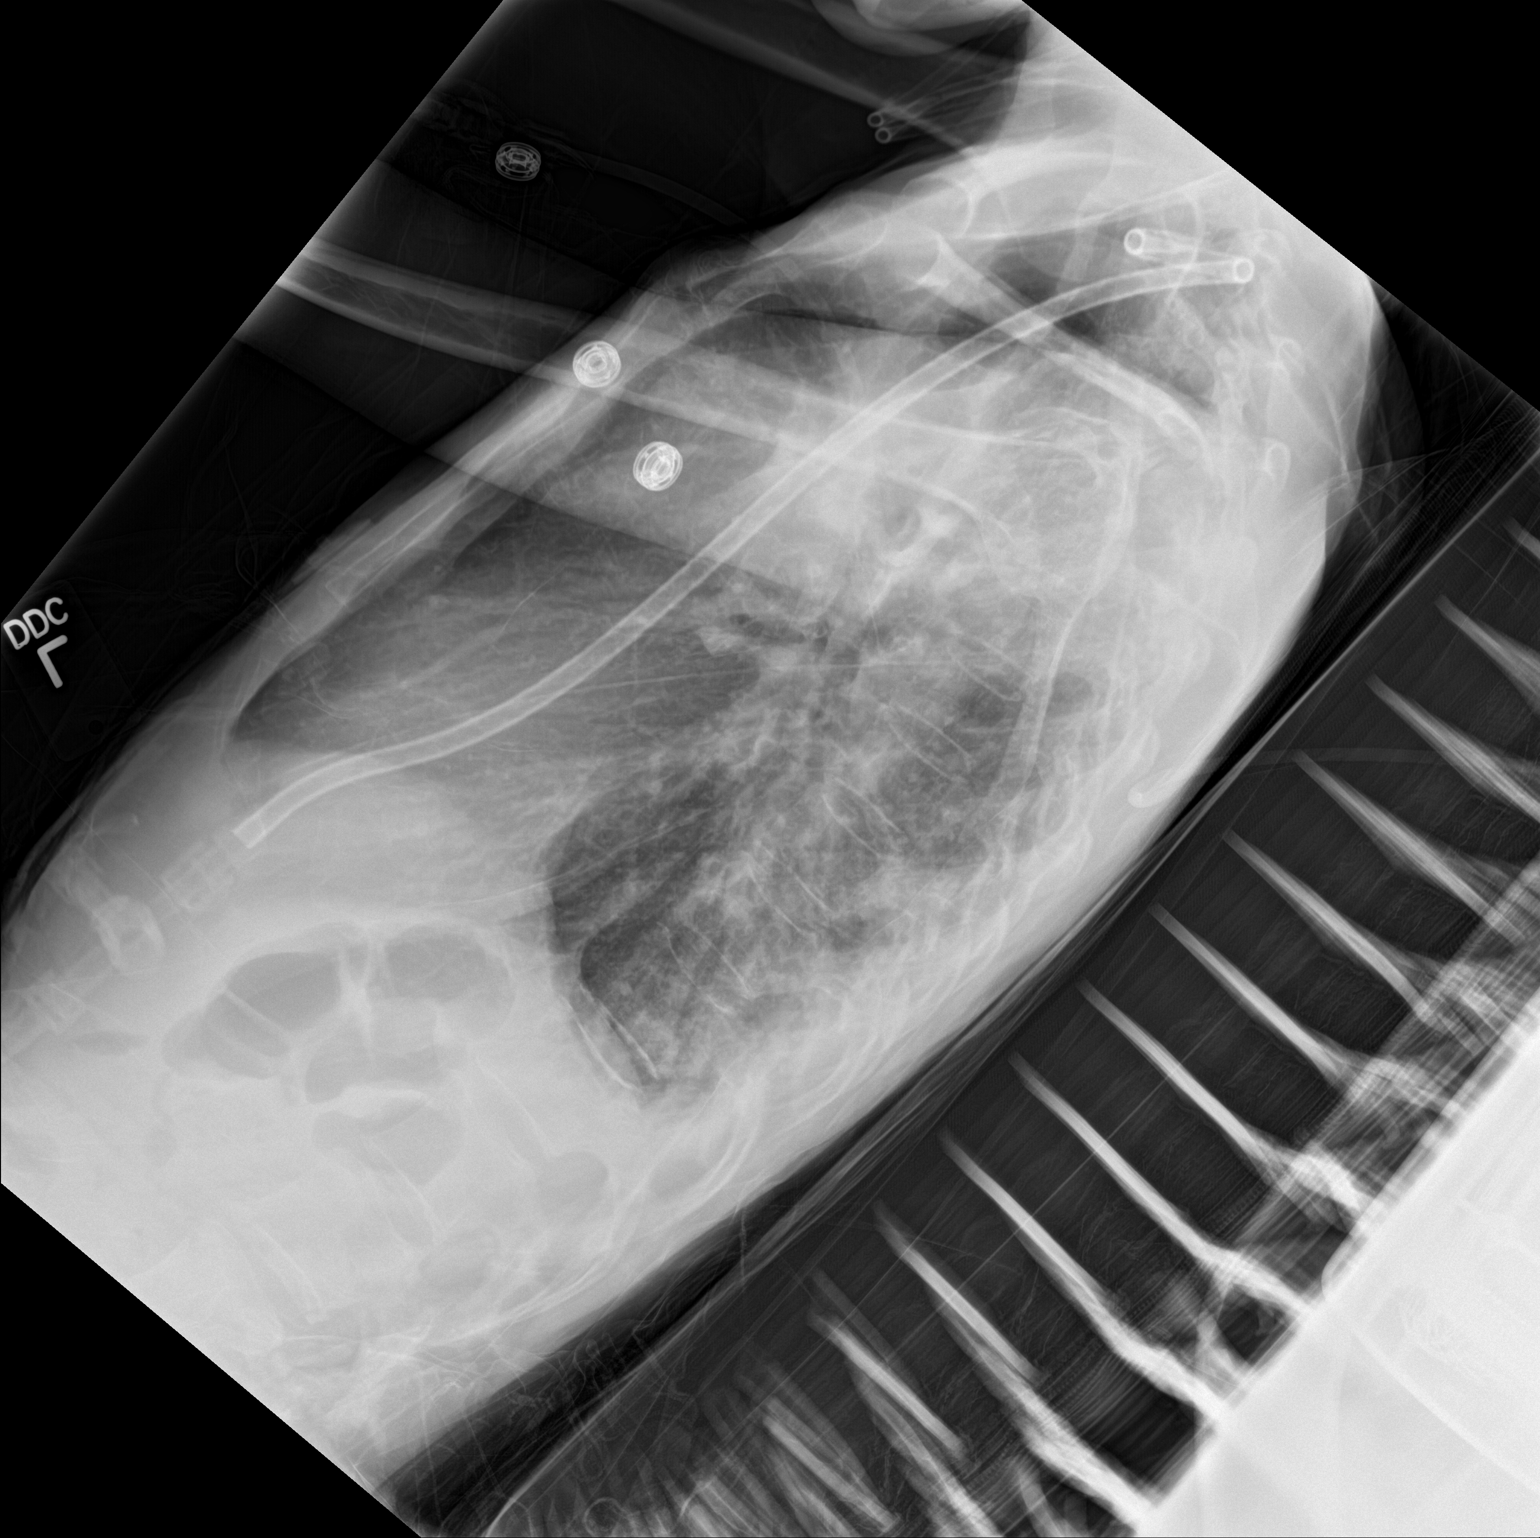
[im 2/2]
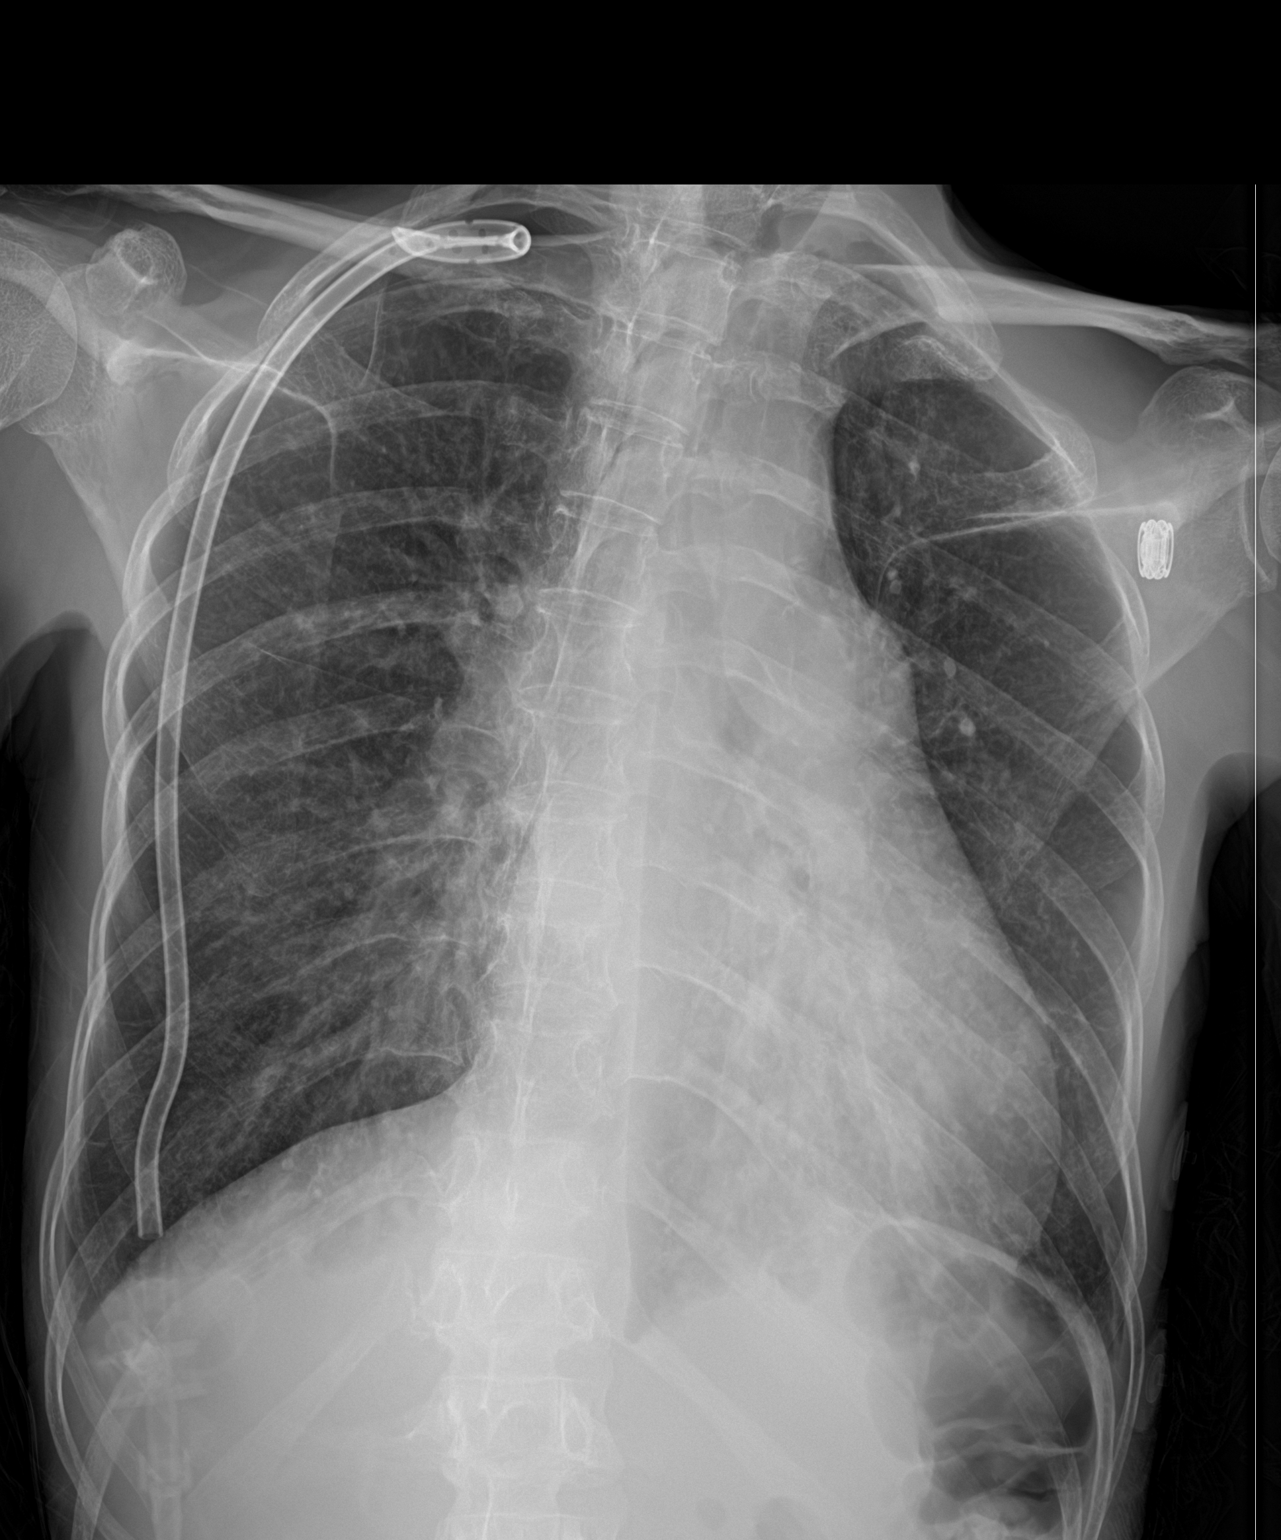

[2 of 2 positions shown; findings below may reference images not displayed]

FINDINGS: The lungs remain hyperinflated. The small caliber chest tube tip
remains coiled in the pulmonary apex. A small apical pneumothorax is
visible with the pleural line lying between the second and third
posterior ribs. There is no mediastinal shift. The left lung
exhibits coarse interstitial markings in the lower lobe posteriorly.
Minimal blunting of the posterior costophrenic angles bilaterally
likely reflects a trace pleural fluid. The cardiac silhouette
remains mildly enlarged. The pulmonary vascularity is normal. The
bony thorax exhibits no acute abnormality.
IMPRESSION: 1. Slight interval increase in the volume of the right apical
pneumothorax which now amounts to 5-10% of the lung volume. Stable
appearance of the small caliber right-sided chest tube whose tip
lies in the apex just above the level of the pleural line.
2. Persistent interstitial infiltrate in the left lower lobe
posteriorly. Trace pleural effusions blunt the posterior
costophrenic angles.
3. Persistent cardiomegaly without pulmonary vascular congestion.
There is underlying COPD-reactive airway disease.
4. These results were called by telephone at the time of
interpretation on 03/04/2015 at [DATE] to Robyn Chu, RN,, who
verbally acknowledged these results.

## 2016-01-22 IMAGING — CR DG CHEST 1V PORT
1 series · 1 of 1 positions shown · non-contrast
Comparison: 03/04/2015.

CLINICAL DATA: Pneumothorax.

EXAM:
PORTABLE CHEST 1 VIEW

[portable]
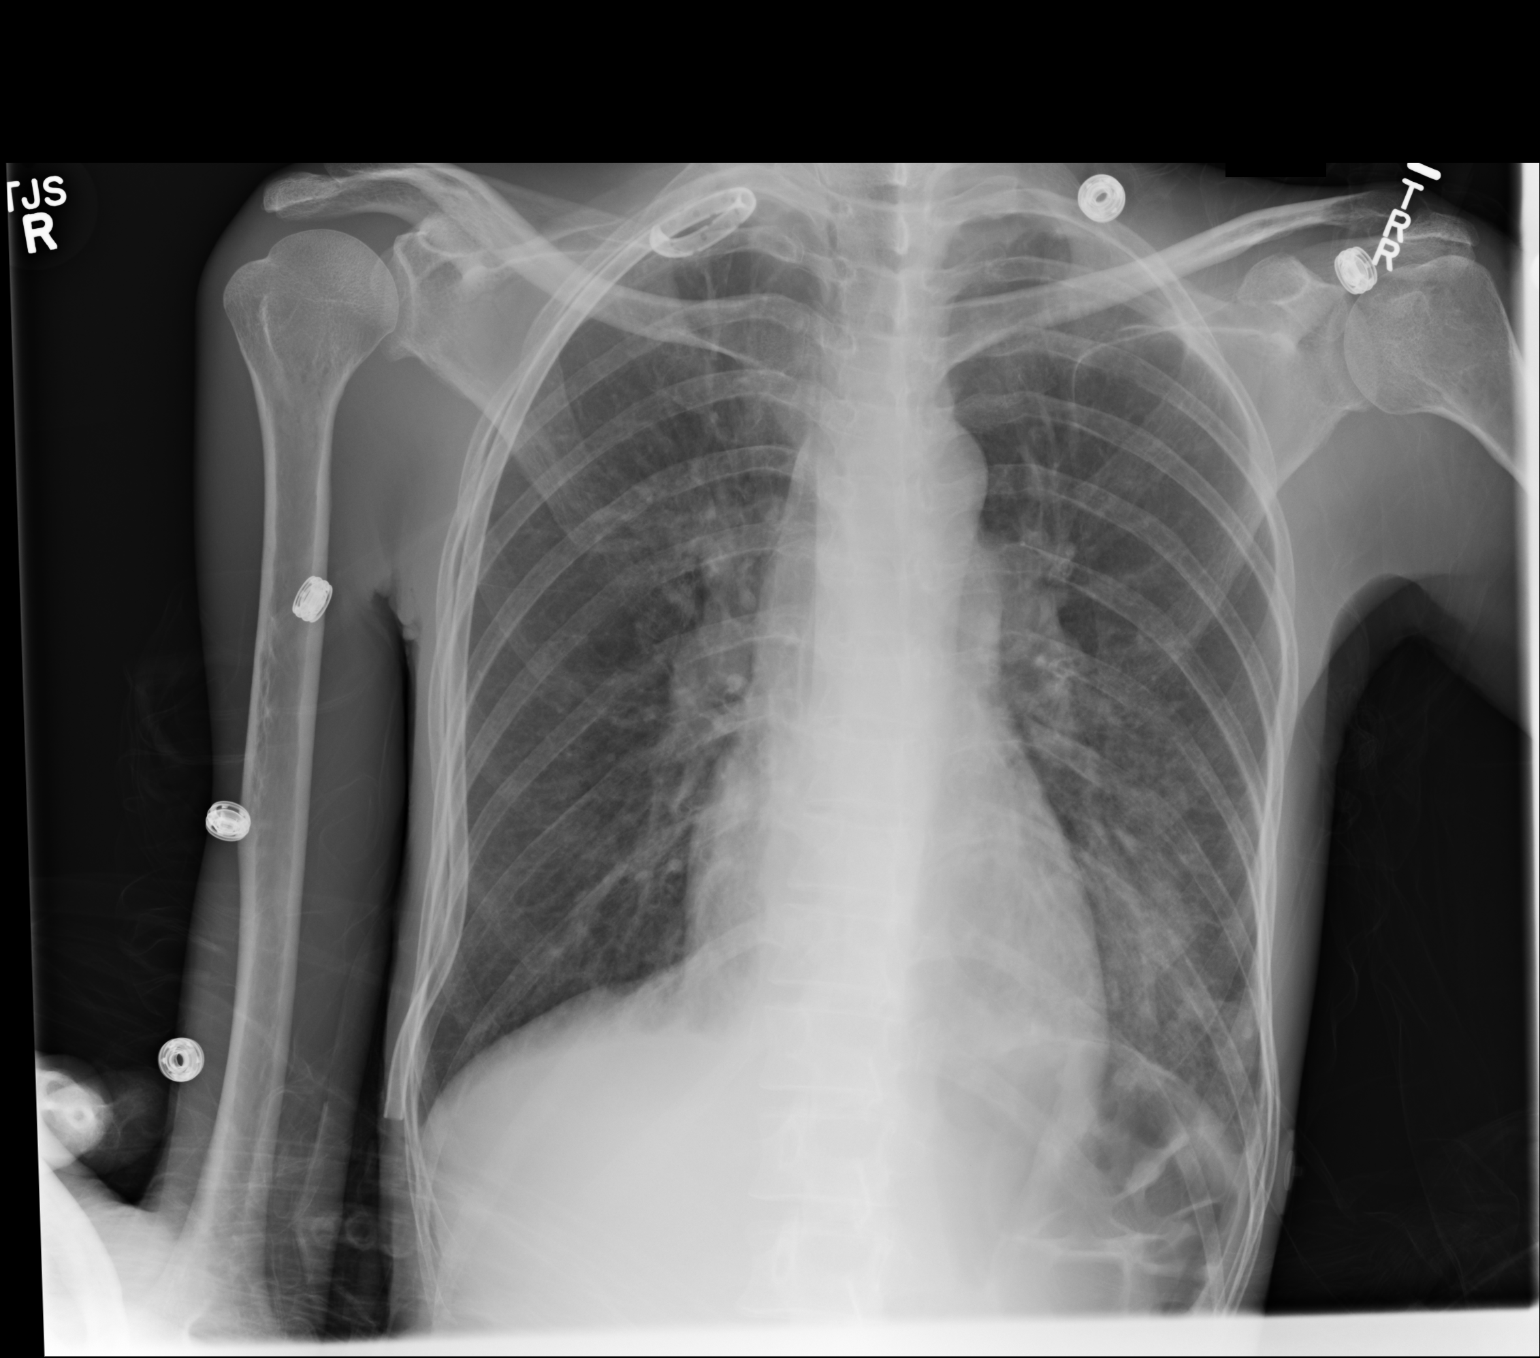

[1 of 1 positions shown; findings below may reference images not displayed]

FINDINGS: Right chest tube in stable position. Interim near complete
resolution of small right apical pneumothorax. Left lower lobe
infiltrate. Small left pleural effusion. Heart size normal. No acute
bony abnormality.
IMPRESSION: 1. Right chest tube in stable position. Interval near complete
resolution of tiny right apical pneumothorax.

2. Left lower lobe mild infiltrate.

## 2016-01-23 IMAGING — CR DG CHEST 2V
2 series · 2 of 2 positions shown · non-contrast
Comparison: Chest radiograph 03/05/2015

CLINICAL DATA: Evaluate for pneumothorax.

EXAM:
CHEST  2 VIEW

[chest lat]
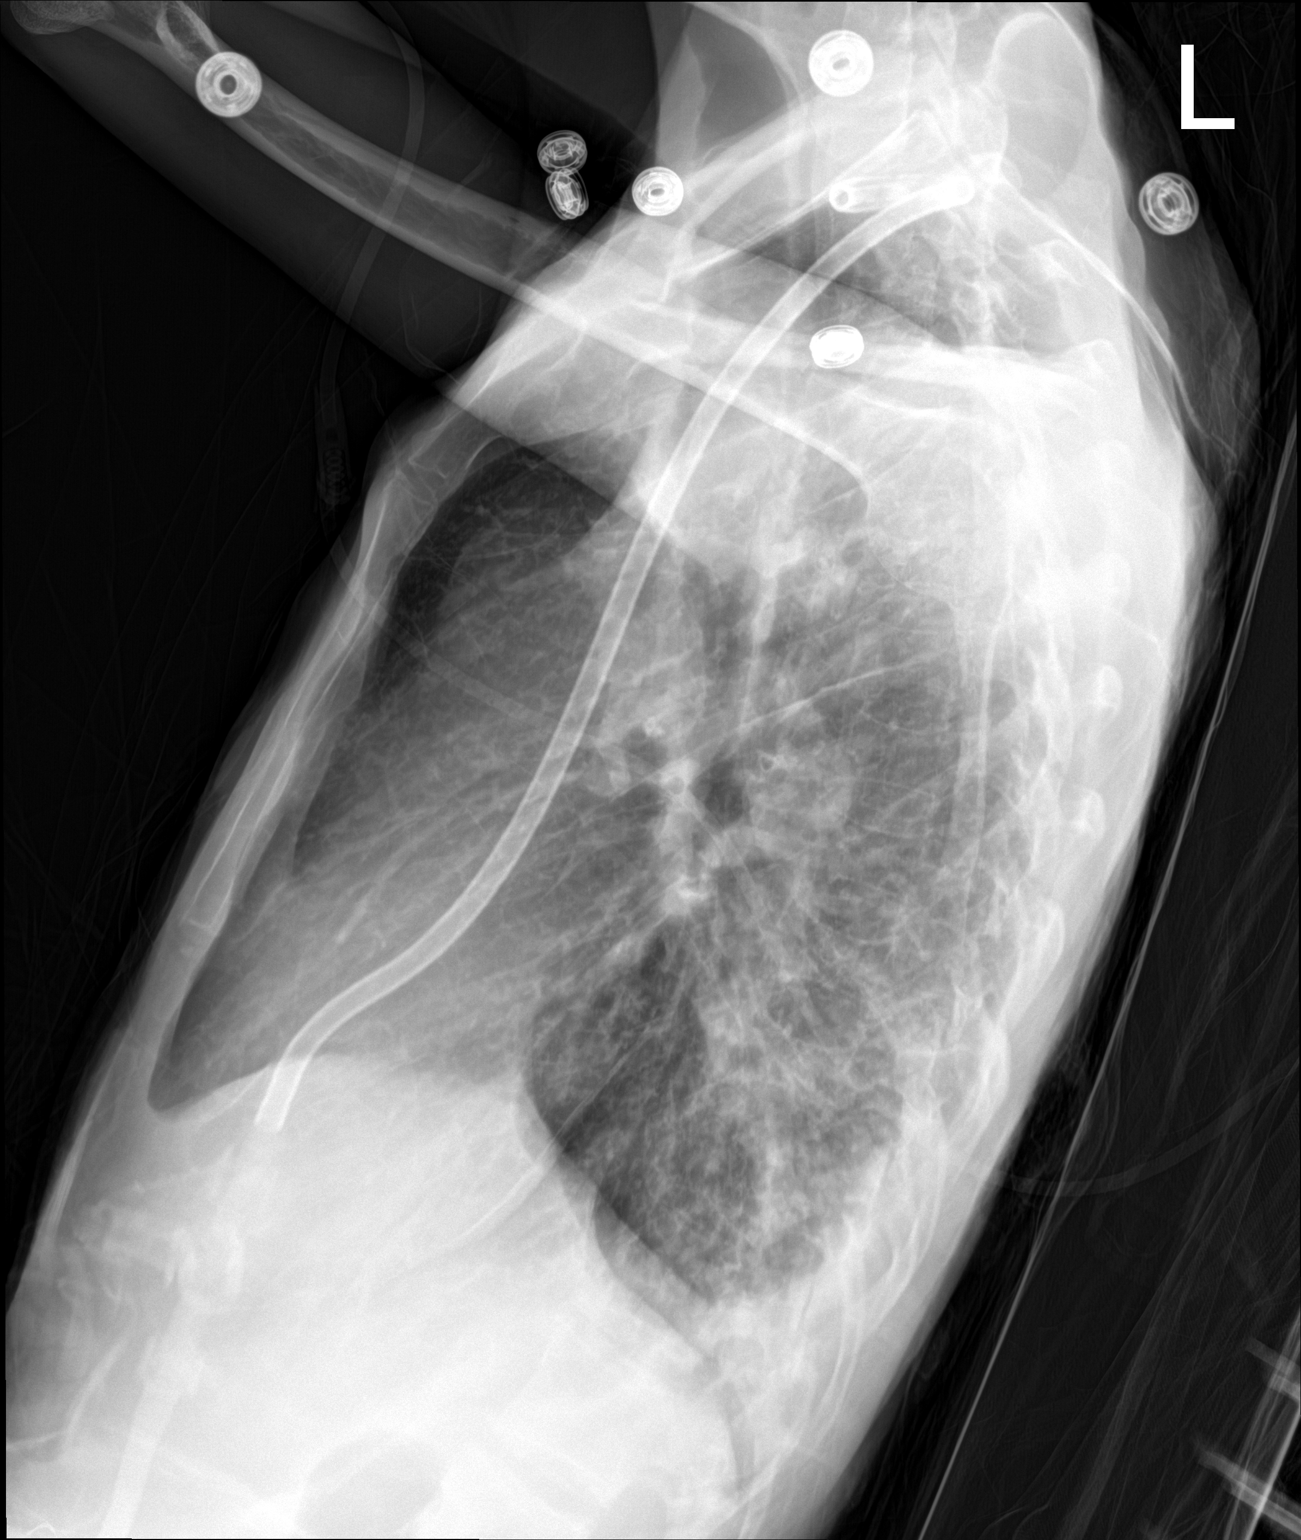

[chest ap]
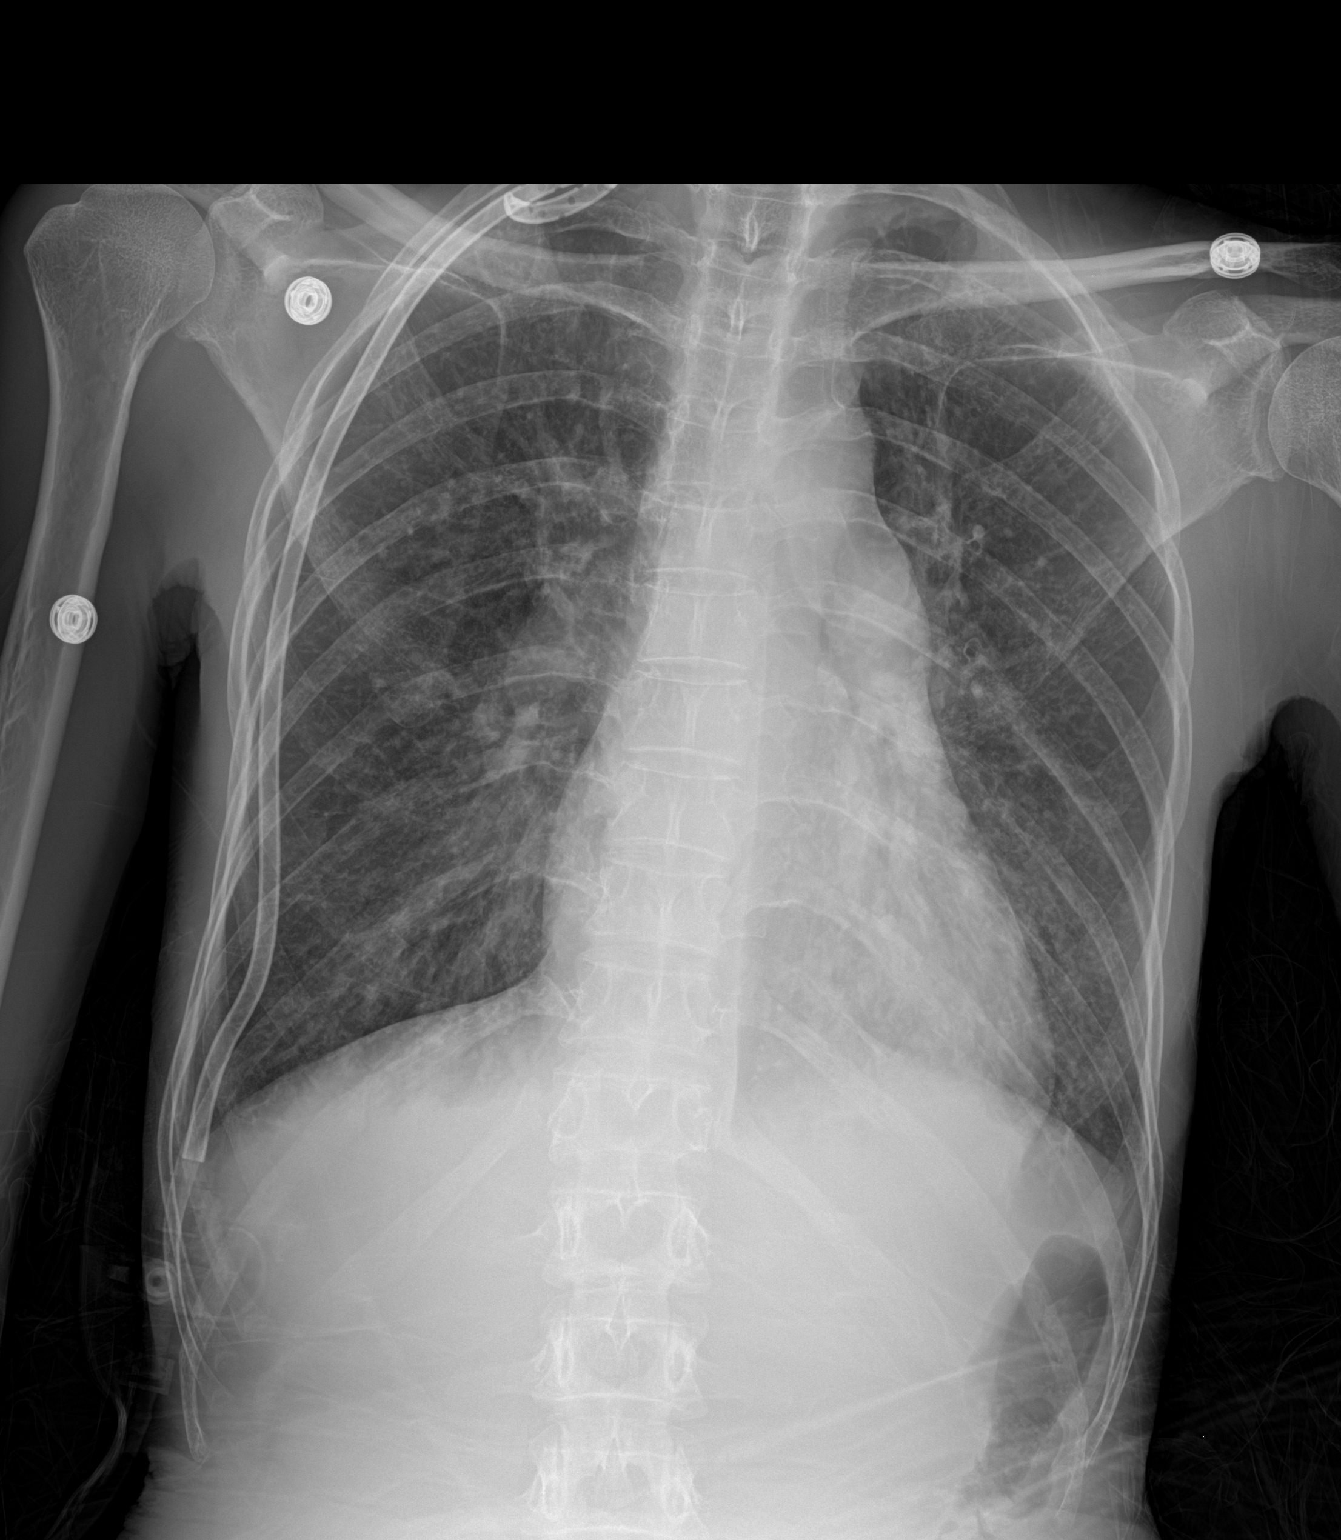

[2 of 2 positions shown; findings below may reference images not displayed]

FINDINGS: Stable position right chest tube, overlying the right lung apex.
Stable enlarged cardiac mediastinal contours. Pulmonary vascular
redistribution. Heterogeneous opacities left lower lobe. No definite
residual right-sided pneumothorax. Small bilateral pleural
effusions.
IMPRESSION: Right-sided chest tube remains in place without significant residual
pneumothorax.

Residual heterogeneous opacities left lower lobe.

## 2016-01-24 IMAGING — CR DG CHEST 1V PORT
1 series · 1 of 1 positions shown · non-contrast
Comparison: CT chest earlier today. Chest x-rays 03/06/2015 and
earlier.

CLINICAL DATA: 44-year-old admitted 03/01/2015 with a spontaneous
right pneumothorax. Pleural drainage catheter removed earlier today.

EXAM:
PORTABLE CHEST 1 VIEW

[ap]
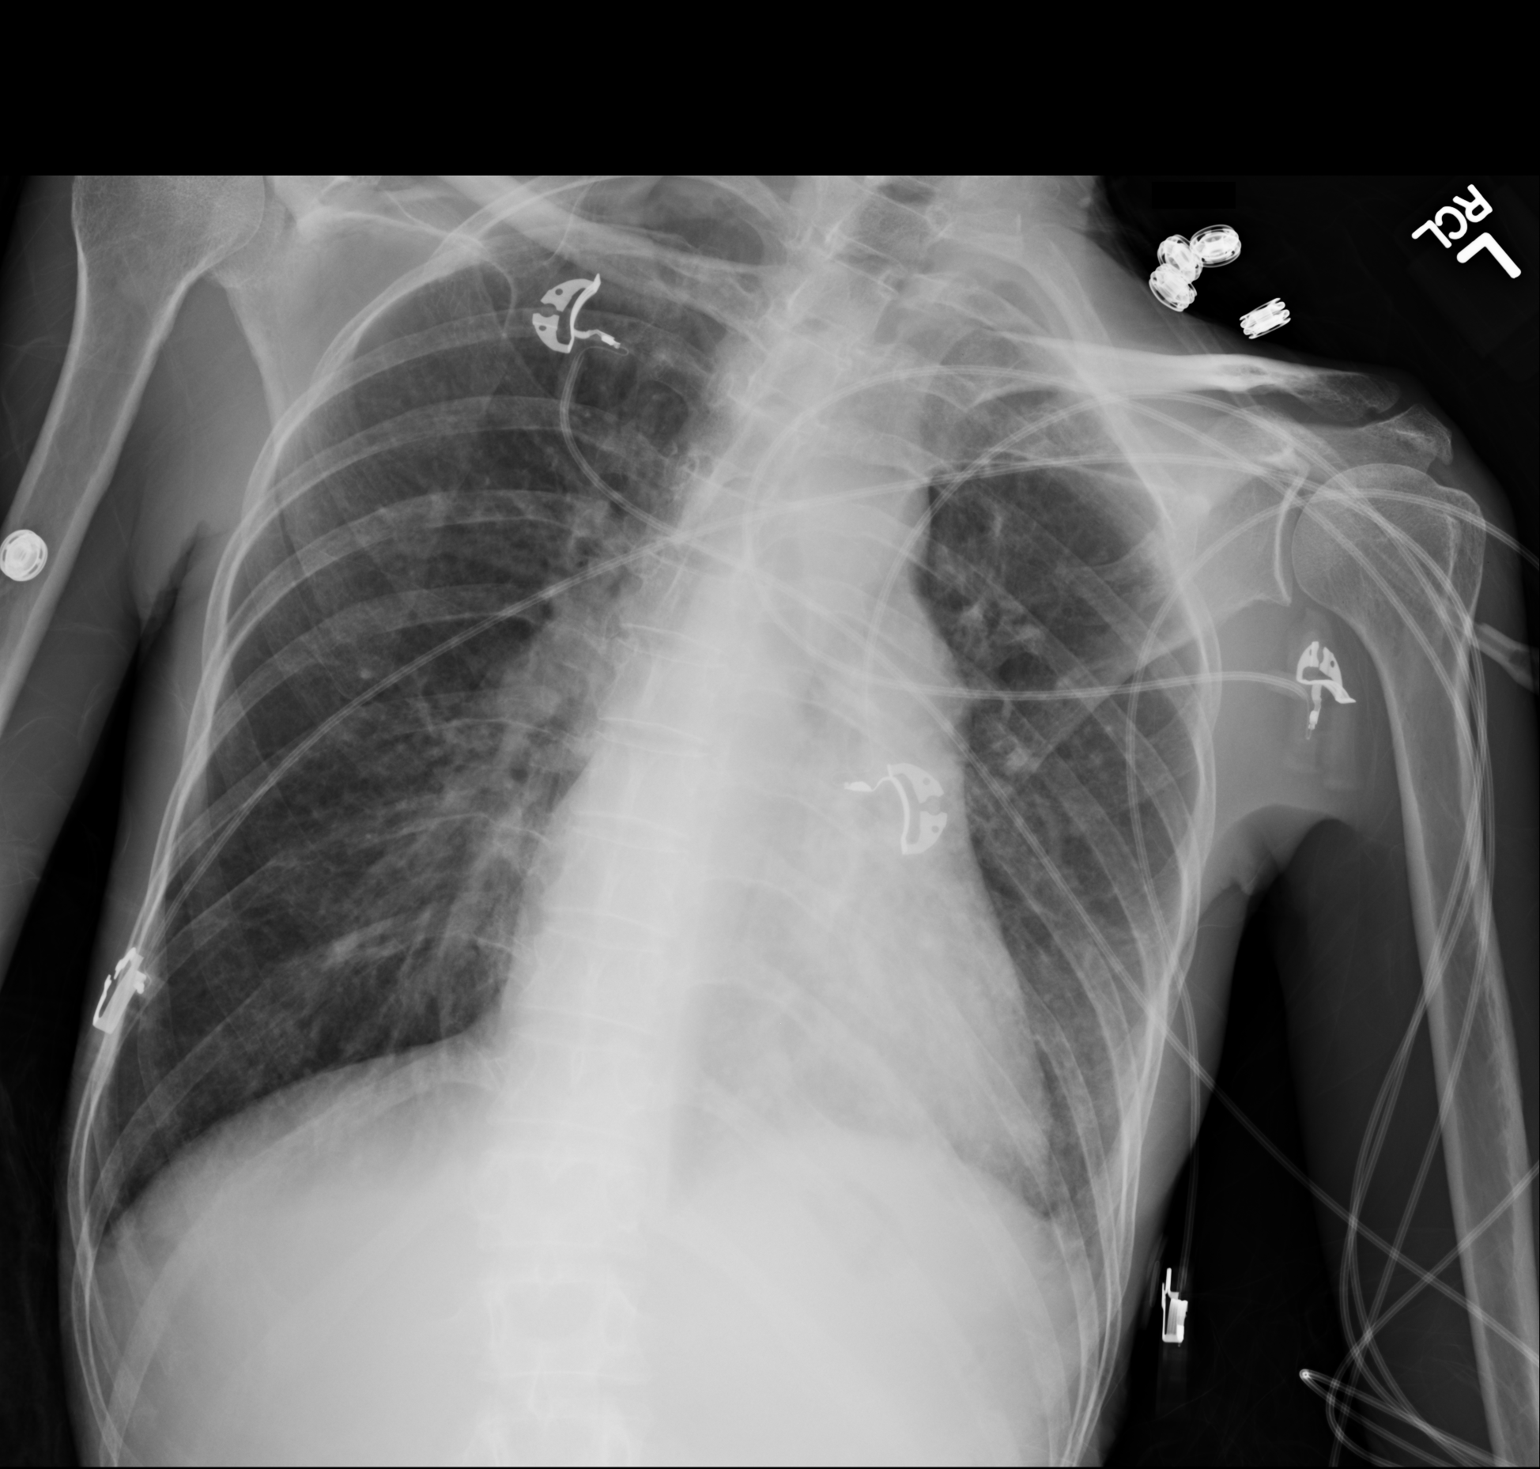

[1 of 1 positions shown; findings below may reference images not displayed]

FINDINGS: Interval right chest tube removal. No evidence of pneumothorax.
Emphysematous changes throughout both lungs, hyperinflation,
prominent bronchovascular markings diffusely, and patchy airspace
opacities in the lung bases (left greater than right), unchanged. No
new pulmonary parenchymal abnormalities.
IMPRESSION: 1. No pneumothorax after right chest tube removal.
2. Stable atelectasis and/or pneumonia in the lower lobes, left
greater than right, superimposed upon COPD/emphysema.
